# Patient Record
Sex: Female | Born: 1995
Health system: Southern US, Community
[De-identification: ages and names within clinical notes are randomized; demographics above are authoritative.]

## PROBLEM LIST (undated history)

## (undated) ENCOUNTER — Inpatient Hospital Stay (HOSPITAL_COMMUNITY): Payer: Self-pay

## (undated) DIAGNOSIS — R519 Headache, unspecified: Secondary | ICD-10-CM

## (undated) DIAGNOSIS — J45909 Unspecified asthma, uncomplicated: Secondary | ICD-10-CM

## (undated) DIAGNOSIS — A749 Chlamydial infection, unspecified: Secondary | ICD-10-CM

## (undated) HISTORY — PX: TOOTH EXTRACTION: SUR596

## (undated) HISTORY — DX: Unspecified asthma, uncomplicated: J45.909

## (undated) HISTORY — PX: INDUCED ABORTION: SHX677

---
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
## 2015-01-04 ENCOUNTER — Ambulatory Visit (INDEPENDENT_AMBULATORY_CARE_PROVIDER_SITE_OTHER): Payer: Medicaid Other | Admitting: Pediatrics

## 2015-01-04 ENCOUNTER — Encounter (INDEPENDENT_AMBULATORY_CARE_PROVIDER_SITE_OTHER): Payer: Self-pay

## 2015-01-04 ENCOUNTER — Encounter: Payer: Self-pay | Admitting: Pediatrics

## 2015-01-04 VITALS — BP 97/63 | HR 78 | Ht 63.5 in | Wt 123.4 lb

## 2015-01-04 DIAGNOSIS — Z720 Tobacco use: Secondary | ICD-10-CM | POA: Diagnosis not present

## 2015-01-04 DIAGNOSIS — F17200 Nicotine dependence, unspecified, uncomplicated: Secondary | ICD-10-CM

## 2015-01-04 DIAGNOSIS — Z113 Encounter for screening for infections with a predominantly sexual mode of transmission: Secondary | ICD-10-CM | POA: Diagnosis not present

## 2015-01-04 DIAGNOSIS — Z309 Encounter for contraceptive management, unspecified: Secondary | ICD-10-CM

## 2015-01-04 DIAGNOSIS — Z3046 Encounter for surveillance of implantable subdermal contraceptive: Secondary | ICD-10-CM

## 2015-01-04 MED ORDER — BUPROPION HCL ER (XL) 150 MG PO TB24: 150.0000 mg | ORAL_TABLET | Freq: Every day | ORAL | Status: DC

## 2015-01-04 NOTE — Progress Notes (Signed)
Adolescent Medicine Consultation Follow-Up Visit Molly Hensley  is a 19 y.o. female referred by Angelina Pih, MD here today for follow-up of nexplanon.   Previsit planning completed:  yes  Growth Chart Viewed? yes  PCP Confirmed?  Needs reassignment    History was provided by the patient.  HPI:  Going to school at the [employer]. Enjoying that. nexplanon has been going well. Started a period on Friday that has been very light. She didn't have one for 5 months before that. No new partners since last visit, however, current partner likely has other partners as they aren't exclusive. Still has some cramping sometimes. No change in vaginal d/c.     Still having dizziness sometimes but is much better than before.   She is still smoking and would like to quit. She has tried on her own in the past and would like to talk about medication options for help.   Patient's last menstrual period was 12/30/2014 (approximate).  The following portions of the patient's history were reviewed and updated as appropriate: allergies, current medications, past family history, past medical history, past social history and problem list.  No Known Allergies   Review of Systems  Constitutional: Negative for weight loss and malaise/fatigue.  Eyes: Negative for blurred vision.  Respiratory: Negative for shortness of breath.   Cardiovascular: Negative for chest pain and palpitations.  Gastrointestinal: Negative for nausea, vomiting, abdominal pain and constipation.  Genitourinary: Negative for dysuria.  Musculoskeletal: Negative for myalgias.  Neurological: Positive for headaches. Negative for dizziness.  Psychiatric/Behavioral: Negative for depression.     Social History: Sleep: sleeping well  Eating Habits: eating well  Exercise: None School: [employer] school  Future Plans: doing hair and makeup.   Confidentiality was discussed with the patient and if applicable, with caregiver as  well.  Patient's personal or confidential phone number:  Tobacco? yes, about a pack a week  Secondhand smoke exposure?yes Drugs/EtOH?yes, occasional drinking and MJ Sexually active?yes Pregnancy Prevention: nexplanon, reviewed condoms & plan B Safe at home, in school & in relationships? Yes Guns in the home? no Safe to self? Yes  Physical Exam:  Filed Vitals:   01/04/15 0954  BP: 97/63  Pulse: 78  Height: 5' 3.5" (1.613 m)  Weight: 123 lb 6.4 oz (55.974 kg)   BP 97/63 mmHg  Pulse 78  Ht 5' 3.5" (1.613 m)  Wt 123 lb 6.4 oz (55.974 kg)  BMI 21.51 kg/m2  LMP 12/30/2014 (Approximate) Body mass index: body mass index is 21.51 kg/(m^2). Blood pressure percentiles are 11% systolic and 44% diastolic based on 2000 NHANES data. Blood pressure percentile targets: 90: 123/79, 95: 127/83, 99 + 5 mmHg: 139/95.  Physical Exam  Constitutional: She is oriented to person, place, and time. She appears well-developed and well-nourished.  HENT:  Head: Normocephalic.  Neck: No thyromegaly present.  Cardiovascular: Normal rate, regular rhythm, normal heart sounds and intact distal pulses.   Pulmonary/Chest: Effort normal and breath sounds normal.  Abdominal: Soft. Bowel sounds are normal. There is no tenderness.  Musculoskeletal: Normal range of motion.  Neurological: She is alert and oriented to person, place, and time.  Skin: Skin is warm and dry.  Psychiatric: She has a normal mood and affect.    Assessment/Plan: 1. Surveillance of implantable subdermal contraceptive In place with no complications. Patient is happy with contraceptive method. Having some minimal bleeding and cramping. Discussed return precautions.   2. Tobacco use disorder Will try wellbutrin xl 150 mg daily to  help with smoking cessation as patient is ready to quit at this time. Discussed taking daily for 1 week and then stopping cigarettes. Will use for 8-12 weeks and then d/c if cessation is going well. Provided  materials for further reading on smoking cessation and habit changes.   - buPROPion (WELLBUTRIN XL) 150 MG 24 hr tablet; Take 1 tablet (150 mg total) by mouth daily.  Dispense: 30 tablet; Refill: 0  3. Routine screening for STI (sexually transmitted infection) Given that patient has a partner with whom she is not exclusive, will screen for STI in the setting of some new bleeding with nexplanon.  - GC/chlamydia probe amp, urine   Follow-up:  1 month about wellbutrin and smoking cessation   Medical decision-making:  > 25 minutes spent, more than 50% of appointment was spent discussing diagnosis and management of symptoms

## 2015-01-04 NOTE — Patient Instructions (Addendum)
Take wellbutrin xl every morning when you wake up. After 1 week of taking, stop smoking. We will leave you on the medication for 8-12 weeks as you continue to work through quitting. We will see you back in 1 month to see how it is going.   Smoking Cessation, Tips for Success If you are ready to quit smoking, congratulations! You have chosen to help yourself be healthier. Cigarettes bring nicotine, tar, carbon monoxide, and other irritants into your body. Your lungs, heart, and blood vessels will be able to work better without these poisons. There are many different ways to quit smoking. Nicotine gum, nicotine patches, a nicotine inhaler, or nicotine nasal spray can help with physical craving. Hypnosis, support groups, and medicines help break the habit of smoking. WHAT THINGS CAN I DO TO MAKE QUITTING EASIER?  Here are some tips to help you quit for good:  Pick a date when you will quit smoking completely. Tell all of your friends and family about your plan to quit on that date.  Do not try to slowly cut down on the number of cigarettes you are smoking. Pick a quit date and quit smoking completely starting on that day.  Throw away all cigarettes.   Clean and remove all ashtrays from your home, work, and car.  On a card, write down your reasons for quitting. Carry the card with you and read it when you get the urge to smoke.  Cleanse your body of nicotine. Drink enough water and fluids to keep your urine clear or pale yellow. Do this after quitting to flush the nicotine from your body.  Learn to predict your moods. Do not let a bad situation be your excuse to have a cigarette. Some situations in your life might tempt you into wanting a cigarette.  Never have "just one" cigarette. It leads to wanting another and another. Remind yourself of your decision to quit.  Change habits associated with smoking. If you smoked while driving or when feeling stressed, try other activities to replace smoking.  Stand up when drinking your coffee. Brush your teeth after eating. Sit in a different chair when you read the paper. Avoid alcohol while trying to quit, and try to drink fewer caffeinated beverages. Alcohol and caffeine may urge you to smoke.  Avoid foods and drinks that can trigger a desire to smoke, such as sugary or spicy foods and alcohol.  Ask people who smoke not to smoke around you.  Have something planned to do right after eating or having a cup of coffee. For example, plan to take a walk or exercise.  Try a relaxation exercise to calm you down and decrease your stress. Remember, you may be tense and nervous for the first 2 weeks after you quit, but this will pass.  Find new activities to keep your hands busy. Play with a pen, coin, or rubber band. Doodle or draw things on paper.  Brush your teeth right after eating. This will help cut down on the craving for the taste of tobacco after meals. You can also try mouthwash.   Use oral substitutes in place of cigarettes. Try using lemon drops, carrots, cinnamon sticks, or chewing gum. Keep them handy so they are available when you have the urge to smoke.  When you have the urge to smoke, try deep breathing.  Designate your home as a nonsmoking area.  If you are a heavy smoker, ask your health care provider about a prescription for nicotine chewing gum.  It can ease your withdrawal from nicotine.  Reward yourself. Set aside the cigarette money you save and buy yourself something nice.  Look for support from others. Join a support group or smoking cessation program. Ask someone at home or at work to help you with your plan to quit smoking.  Always ask yourself, "Do I need this cigarette or is this just a reflex?" Tell yourself, "Today, I choose not to smoke," or "I do not want to smoke." You are reminding yourself of your decision to quit.  Do not replace cigarette smoking with electronic cigarettes (commonly called e-cigarettes). The  safety of e-cigarettes is unknown, and some may contain harmful chemicals.  If you relapse, do not give up! Plan ahead and think about what you will do the next time you get the urge to smoke. HOW WILL I FEEL WHEN I QUIT SMOKING? You may have symptoms of withdrawal because your body is used to nicotine (the addictive substance in cigarettes). You may crave cigarettes, be irritable, feel very hungry, cough often, get headaches, or have difficulty concentrating. The withdrawal symptoms are only temporary. They are strongest when you first quit but will go away within 10-14 days. When withdrawal symptoms occur, stay in control. Think about your reasons for quitting. Remind yourself that these are signs that your body is healing and getting used to being without cigarettes. Remember that withdrawal symptoms are easier to treat than the major diseases that smoking can cause.  Even after the withdrawal is over, expect periodic urges to smoke. However, these cravings are generally short lived and will go away whether you smoke or not. Do not smoke! WHAT RESOURCES ARE AVAILABLE TO HELP ME QUIT SMOKING? Your health care provider can direct you to community resources or hospitals for support, which may include:  Group support.  Education.  Hypnosis.  Therapy. Document Released: 03/15/2004 Document Revised: 11/01/2013 Document Reviewed: 12/03/2012 [facility] Patient Information 2015 [place], [place]. This information is not intended to replace advice given to you by your health care provider. Make sure you discuss any questions you have with your health care provider. Bupropion extended-release tablets (Depression/Mood Disorders) What is this medicine? BUPROPION (byoo PROE pee on) is used to treat depression. This medicine may be used for other purposes; ask your health care provider or pharmacist if you have questions. COMMON BRAND NAME(S): Aplenzin, Budeprion XL, Forfivo XL, Wellbutrin XL What should I  tell my health care provider before I take this medicine? They need to know if you have any of these conditions: -an eating disorder, such as anorexia or bulimia -bipolar disorder or psychosis -diabetes or high blood sugar, treated with medication -glaucoma -head injury or brain tumor -heart disease, previous heart attack, or irregular heart beat -high blood pressure -kidney or liver disease -seizures (convulsions) -suicidal thoughts or a previous suicide attempt -Tourette's syndrome -weight loss -an unusual or allergic reaction to bupropion, other medicines, foods, dyes, or preservatives -breast-feeding -pregnant or trying to become pregnant How should I use this medicine? Take this medicine by mouth with a glass of water. Follow the directions on the prescription label. You can take it with or without food. If it upsets your stomach, take it with food. Do not crush, chew, or cut these tablets. This medicine is taken once daily at the same time each day. Do not take your medicine more often than directed. Do not stop taking this medicine suddenly except upon the advice of your doctor. Stopping this medicine too quickly may cause  serious side effects or your condition may worsen. A special MedGuide will be given to you by the pharmacist with each prescription and refill. Be sure to read this information carefully each time. Talk to your pediatrician regarding the use of this medicine in children. Special care may be needed. Overdosage: If you think you have taken too much of this medicine contact a poison control center or emergency room at once. NOTE: This medicine is only for you. Do not share this medicine with others. What if I miss a dose? If you miss a dose, skip the missed dose and take your next tablet at the regular time. Do not take double or extra doses. What may interact with this medicine? Do not take this medicine with any of the following medications: -linezolid -MAOIs like  Azilect, Carbex, Eldepryl, Marplan, Nardil, and Parnate -methylene blue (injected into a vein) -other medicines that contain bupropion like Zyban This medicine may also interact with the following medications: -alcohol -certain medicines for anxiety or sleep -certain medicines for blood pressure like metoprolol, propranolol -certain medicines for depression or psychotic disturbances -certain medicines for HIV or AIDS like efavirenz, lopinavir, nelfinavir, ritonavir -certain medicines for irregular heart beat like propafenone, flecainide -certain medicines for Parkinson's disease like amantadine, levodopa -certain medicines for seizures like carbamazepine, phenytoin, phenobarbital -cimetidine -clopidogrel -cyclophosphamide -furazolidone -isoniazid -nicotine -orphenadrine -procarbazine -steroid medicines like prednisone or cortisone -stimulant medicines for attention disorders, weight loss, or to stay awake -tamoxifen -theophylline -thiotepa -ticlopidine -tramadol -warfarin This list may not describe all possible interactions. Give your health care provider a list of all the medicines, herbs, non-prescription drugs, or dietary supplements you use. Also tell them if you smoke, drink alcohol, or use illegal drugs. Some items may interact with your medicine. What should I watch for while using this medicine? Tell your doctor if your symptoms do not get better or if they get worse. Visit your doctor or health care professional for regular checks on your progress. Because it may take several weeks to see the full effects of this medicine, it is important to continue your treatment as prescribed by your doctor. Patients and their families should watch out for new or worsening thoughts of suicide or depression. Also watch out for sudden changes in feelings such as feeling anxious, agitated, panicky, irritable, hostile, aggressive, impulsive, severely restless, overly excited and hyperactive, or  not being able to sleep. If this happens, especially at the beginning of treatment or after a change in dose, call your health care professional. Avoid alcoholic drinks while taking this medicine. Drinking large amounts of alcoholic beverages, using sleeping or anxiety medicines, or quickly stopping the use of these agents while taking this medicine may increase your risk for a seizure. Do not drive or use heavy machinery until you know how this medicine affects you. This medicine can impair your ability to perform these tasks. Do not take this medicine close to bedtime. It may prevent you from sleeping. Your mouth may get dry. Chewing sugarless gum or sucking hard candy, and drinking plenty of water may help. Contact your doctor if the problem does not go away or is severe. The tablet shell for some brands of this medicine does not dissolve. This is normal. The tablet shell may appear whole in the stool. This is not a cause for concern. What side effects may I notice from receiving this medicine? Side effects that you should report to your doctor or health care professional as soon as possible: -allergic reactions  like skin rash, itching or hives, swelling of the face, lips, or tongue -breathing problems -changes in vision -confusion -fast or irregular heartbeat -hallucinations -increased blood pressure -redness, blistering, peeling or loosening of the skin, including inside the mouth -seizures -suicidal thoughts or other mood changes -unusually weak or tired -vomiting Side effects that usually do not require medical attention (report to your doctor or health care professional if they continue or are bothersome): -change in sex drive or performance -constipation -headache -loss of appetite -nausea -tremors -weight loss This list may not describe all possible side effects. Call your doctor for medical advice about side effects. You may report side effects to FDA at 1-800-FDA-1088. Where  should I keep my medicine? Keep out of the reach of children. Store at room temperature between 15 and 30 degrees C (59 and 86 degrees F). Throw away any unused medicine after the expiration date. NOTE: This sheet is a summary. It may not cover all possible information. If you have questions about this medicine, talk to your doctor, pharmacist, or health care provider.  2015, [employer]. (2013-01-08 12:39:42)

## 2015-01-05 ENCOUNTER — Telehealth: Payer: Self-pay | Admitting: *Deleted

## 2015-01-05 LAB — GC/CHLAMYDIA PROBE AMP, URINE
Chlamydia, Swab/Urine, PCR: POSITIVE — AB
GC Probe Amp, Urine: NEGATIVE

## 2015-01-05 NOTE — Telephone Encounter (Signed)
-----   Message from Verneda Skillaroline T Hacker, FNP sent at 01/05/2015 10:14 AM EDT ----- Patient should come to clinic to be treated and obtain EPT. Can come to see me tomorrow or Neysa BonitoChristy in the PM. If totally unable to make an appointment will send RX to pharmacy and partner can visit health department.

## 2015-01-05 NOTE — Telephone Encounter (Signed)
TC returned by pt. Pt scheduled 01/06/15 with Rayfield Citizenaroline for tx. Pt verbalized understanding. Agreeable to appt.

## 2015-01-05 NOTE — Telephone Encounter (Signed)
TC to pt. LVM to callback and discuss recent labs. Callback number provided, confidentiality maintained.

## 2015-01-06 ENCOUNTER — Ambulatory Visit (INDEPENDENT_AMBULATORY_CARE_PROVIDER_SITE_OTHER): Payer: Medicaid Other | Admitting: Pediatrics

## 2015-01-06 ENCOUNTER — Encounter: Payer: Self-pay | Admitting: Pediatrics

## 2015-01-06 VITALS — BP 122/77 | HR 100 | Ht 63.5 in | Wt 121.0 lb

## 2015-01-06 DIAGNOSIS — A749 Chlamydial infection, unspecified: Secondary | ICD-10-CM | POA: Diagnosis not present

## 2015-01-06 DIAGNOSIS — Z113 Encounter for screening for infections with a predominantly sexual mode of transmission: Secondary | ICD-10-CM | POA: Diagnosis not present

## 2015-01-06 LAB — HIV ANTIBODY (ROUTINE TESTING W REFLEX): HIV 1&2 Ab, 4th Generation: NONREACTIVE

## 2015-01-06 MED ORDER — AZITHROMYCIN 250 MG PO TABS
1000.0000 mg | ORAL_TABLET | Freq: Once | ORAL | Status: AC
  Administered 2015-01-06: 1000 mg via ORAL
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
## 2015-01-06 NOTE — Patient Instructions (Signed)
No sex for 7 days after treatment or you risk reinfection.  ALWAYS use condoms. Any body fluid contact can transmit infections.   [email or web address] -- use this anonymous texting service to let other partners know that they have been exposed so they can get treated as well.   Have someone take the pills to your partner for treatment if you are unable to.   We will call you with the other lab results next week.

## 2015-01-07 LAB — RPR

## 2015-01-09 ENCOUNTER — Telehealth: Payer: Self-pay | Admitting: *Deleted

## 2015-01-09 NOTE — Telephone Encounter (Signed)
-----   Message from Verneda Skillaroline T Hacker, FNP sent at 01/08/2015  5:09 PM EDT ----- HIV and RPR negative. Continue to use condoms and we will rescreen for chlamydia at next visit.

## 2015-01-09 NOTE — Telephone Encounter (Signed)
TC returned by pt, reviewed negative results, advised we will recheck at next appt, and reminding of f/u appt date.

## 2015-01-09 NOTE — Telephone Encounter (Signed)
TC to pt, LVM requesting callback to discuss recent labs. Number provided, confidentiality maintained.

## 2015-02-01 ENCOUNTER — Other Ambulatory Visit: Payer: Self-pay | Admitting: Pediatrics

## 2015-02-01 ENCOUNTER — Emergency Department (HOSPITAL_COMMUNITY)
Admission: EM | Admit: 2015-02-01 | Discharge: 2015-02-01 | Disposition: A | Discharge status: Home / Self Care | Payer: Medicaid Other | Attending: Emergency Medicine | Admitting: Emergency Medicine

## 2015-02-01 ENCOUNTER — Encounter (HOSPITAL_COMMUNITY): Payer: Self-pay | Admitting: *Deleted

## 2015-02-01 DIAGNOSIS — N63 Unspecified lump in unspecified breast: Secondary | ICD-10-CM

## 2015-02-01 DIAGNOSIS — Z720 Tobacco use: Secondary | ICD-10-CM | POA: Insufficient documentation

## 2015-02-01 DIAGNOSIS — Z79899 Other long term (current) drug therapy: Secondary | ICD-10-CM | POA: Diagnosis not present

## 2015-02-01 DIAGNOSIS — Z793 Long term (current) use of hormonal contraceptives: Secondary | ICD-10-CM | POA: Diagnosis not present

## 2015-02-01 DIAGNOSIS — N644 Mastodynia: Secondary | ICD-10-CM | POA: Diagnosis present

## 2015-02-01 DIAGNOSIS — N6001 Solitary cyst of right breast: Secondary | ICD-10-CM

## 2015-02-01 NOTE — ED Provider Notes (Deleted)
CSN: [id number]     Arrival date & time 02/01/15  0902 History   First MD Initiated Contact with Patient 02/01/15 [phone]     Chief Complaint  Patient presents with  . Breast Problem     (Consider location/radiation/quality/duration/timing/severity/associated sxs/prior Treatment) HPI  History reviewed. No pertinent past medical history. History reviewed. No pertinent past surgical history. History reviewed. No pertinent family history. History  Substance Use Topics  . Smoking status: Current Every Day Smoker  . Smokeless tobacco: Never Used     Comment: patient   . Alcohol Use: No   OB History    No data available     Review of Systems    Allergies  Review of patient's allergies indicates no known allergies.  Home Medications   Prior to Admission medications   Medication Sig Start Date End Date Taking? Authorizing Provider  buPROPion (WELLBUTRIN XL) 150 MG 24 hr tablet Take 1 tablet (150 mg total) by mouth daily. 01/04/15   Verneda Skill, FNP  etonogestrel (IMPLANON) 68 MG IMPL implant 1 each by Subdermal route once.    Historical Provider, MD  meclizine (ANTIVERT) 50 MG tablet Take 1 tablet (50 mg total) by mouth 3 (three) times daily as needed. 06/12/14   Tatyana Kirichenko, PA-C  ondansetron (ZOFRAN-ODT) 4 MG disintegrating tablet Take 1 tablet (4 mg total) by mouth every 8 (eight) hours as needed for nausea or vomiting. 09/11/14   Charm Rings, MD   BP 100/54 mmHg  Pulse 84  Temp(Src) 98.5 F (36.9 C) (Oral)  Resp 20  Ht  (1.6 m)  Wt 121 lb 5 oz (55.027 kg)  BMI 21.49 kg/m2  SpO2 100%  LMP 12/30/2014 (LMP Unknown) Physical Exam  ED Course  Procedures (including critical care time) Labs Review Labs Reviewed - No data to display  Imaging Review No results found.   EKG Interpretation None      MDM   Final diagnoses:  Breast lump       Elson Areas, PA-C 02/01/15 [phone]

## 2015-02-01 NOTE — ED Notes (Signed)
Declined W/C at D/C and was escorted to lobby by RN. 

## 2015-02-01 NOTE — Discharge Instructions (Signed)
Breast Cyst A breast cyst is a sac in the breast that is filled with fluid. Breast cysts are common in women. Women can have one or many cysts. When the breasts contain many cysts, it is usually due to a noncancerous (benign) condition called fibrocystic change. These lumps form under the influence of female hormones (estrogen and progesterone). The lumps are most often located in the upper, outer portion of the breast. They are often more swollen, painful, and tender before your period starts. They usually disappear after menopause, unless you are on hormone therapy.  There are several types of cysts:  Macrocyst. This is a cyst that is about 2 in. (5.1 cm) in diameter.   Microcyst. This is a tiny cyst that you cannot feel but can be seen with a mammogram or an ultrasound.   Galactocele. This is a cyst containing milk that may develop if you suddenly stop breastfeeding.   Sebaceous cyst of the skin. This type of cyst is not in the breast tissue itself. Breast cysts do not increase your risk of breast cancer. However, they must be monitored closely because they can be cancerous.  CAUSES  It is not known exactly what causes a breast cyst to form. Possible causes include:  An overgrowth of milk glands and connective tissue in the breast can block the milk glands, causing them to fill with fluid.   Scar tissue in the breast from previous surgery may block the glands, causing a cyst.  RISK FACTORS Estrogen may influence the development of a breast cyst.  SIGNS AND SYMPTOMS   Feeling a smooth, round, soft lump (like a grape) in the breast that is easily moveable.   Breast discomfort or pain.  Increase in size of the lump before your menstrual period and decrease in its size after your menstrual period.  DIAGNOSIS  A cyst can be felt during a physical exam by your health care provider. A breast X-ray exam (mammogram) and ultrasonography will be done to confirm the diagnosis. Fluid may  be removed from the cyst with a needle (fine needle aspiration) to make sure the cyst is not cancerous.  TREATMENT  Treatment may not be necessary. Your health care provider may monitor the cyst to see if it goes away on its own. If treatment is needed, it may include:  Hormone treatment.   Needle aspiration. There is a chance of the cyst coming back after aspiration.   Surgery to remove the whole cyst.  HOME CARE INSTRUCTIONS   Keep all follow-up appointments with your health care provider.  See your health care provider regularly:  Get a yearly exam by your health care provider.  Have a clinical breast exam by a health care provider every 1-3 years if you are 4-49 years of age. After age 70 years, you should have the exam every year.   Get mammogram tests as directed by your health care provider.   Understand the normal appearance and feel of your breasts and perform breast self-exams.   Only take over-the-counter or prescription medicines as directed by your health care provider.   Wear a supportive bra, especially when exercising.   Avoid caffeine.   Reduce your salt intake, especially before your menstrual period. Too much salt can cause fluid retention, breast swelling, and discomfort.  SEEK MEDICAL CARE IF:   You feel, or think you feel, a lump in your breast.   You notice that both breasts look or feel different than usual.   Your  breast is still causing pain after your menstrual period is over.   You need medicine for breast pain and swelling that occurs with your menstrual period.  SEEK IMMEDIATE MEDICAL CARE IF:   You have severe pain, tenderness, redness, or warmth in your breast.   You have nipple discharge or bleeding.   Your breast lump becomes hard and painful.   You find new lumps or bumps that were not there before.   You feel lumps in your armpit (axilla).   You notice dimpling or wrinkling of the breast or nipple.   You  have a fever.  MAKE SURE YOU:  Understand these instructions.  Will watch your condition.  Will get help right away if you are not doing well or get worse. Document Released: 06/17/2005 Document Revised: 02/17/2013 Document Reviewed: 01/14/2013 [facility] Patient Information 2015 [place], [place]. This information is not intended to replace advice given to you by your health care provider. Make sure you discuss any questions you have with your health care provider. Warm compresses, ibuprofen

## 2015-02-01 NOTE — ED Provider Notes (Signed)
CSN: [id number]     Arrival date & time 02/01/15  0902 History  This chart was scribed for non-physician practitioner, Langston Masker, PA-C, working with Blane Ohara, MD by Charline Bills, ED Scribe. This patient was seen in room TR06C/TR06C and the patient's care was started at 9:34 AM.  Chief Complaint  Patient presents with  . Breast Problem   The history is provided by the patient. No language interpreter was used.   HPI Comments: Molly Hensley is a 19 y.o. female who presents to the Emergency Department complaining of a gradually worsening painful mass to the right breast first noticed 3 days ago. Pt states that the mass is very tender to palpation. She denies fever, chills, vomiting, diarrhea, drainage from her right nipple. No treatments tried PTA.  PCP: Jairo Ben, MD  History reviewed. No pertinent past medical history. History reviewed. No pertinent past surgical history. History reviewed. No pertinent family history. History  Substance Use Topics  . Smoking status: Current Every Day Smoker  . Smokeless tobacco: Never Used     Comment: patient   . Alcohol Use: No   OB History    No data available     Review of Systems  Constitutional: Negative for fever and chills.  Gastrointestinal: Negative for vomiting and diarrhea.  Skin:       + R breast mass   Allergies  Review of patient's allergies indicates no known allergies.  Home Medications   Prior to Admission medications   Medication Sig Start Date End Date Taking? Authorizing Provider  buPROPion (WELLBUTRIN XL) 150 MG 24 hr tablet Take 1 tablet (150 mg total) by mouth daily. 01/04/15   Verneda Skill, FNP  etonogestrel (IMPLANON) 68 MG IMPL implant 1 each by Subdermal route once.    Historical Provider, MD  meclizine (ANTIVERT) 50 MG tablet Take 1 tablet (50 mg total) by mouth 3 (three) times daily as needed. 06/12/14   Tatyana Kirichenko, PA-C  ondansetron (ZOFRAN-ODT) 4 MG disintegrating tablet Take 1  tablet (4 mg total) by mouth every 8 (eight) hours as needed for nausea or vomiting. 09/11/14   Charm Rings, MD   BP 100/54 mmHg  Pulse 84  Temp(Src) 98.5 F (36.9 C) (Oral)  Resp 20  SpO2 100%  LMP 12/30/2014 (Approximate) Physical Exam  Constitutional: She is oriented to person, place, and time. She appears well-developed and well-nourished. No distress.  HENT:  Head: Normocephalic and atraumatic.  Eyes: Conjunctivae and EOM are normal.  Neck: Neck supple. No tracheal deviation present.  Cardiovascular: Normal rate.   Pulmonary/Chest: Effort normal. No respiratory distress.  Genitourinary:  R breast: Marble sized nodule to R breast midline. Tenderness to palpation.  Musculoskeletal: Normal range of motion.  Neurological: She is alert and oriented to person, place, and time.  Skin: Skin is warm and dry.  Psychiatric: She has a normal mood and affect. Her behavior is normal.  Nursing note and vitals reviewed.  ED Course  Procedures (including critical care time) DIAGNOSTIC STUDIES: Oxygen Saturation is 100% on RA, normal by my interpretation.    COORDINATION OF CARE: 9:37 AM-Discussed treatment plan which includes ibuprofen, warm compresses and follow-up with PCP with pt at bedside and pt agreed to plan.   Labs Review Labs Reviewed - No data to display  Imaging Review No results found.   EKG Interpretation None      MDM   Final diagnoses:  Breast lump    [facility]  I personally performed the services in this documentation, which was scribed in my presence.  The recorded information has been reviewed and considered.   Barnet Pall.  Lonia Skinner [place], PA-C 02/01/15 1633  Blane Ohara, MD 02/01/15 [phone]

## 2015-02-01 NOTE — ED Notes (Signed)
Pt reports knott to RT breast . Pt reports breast tissue is sore to touch. Pt denies any drainage from nipple.

## 2015-02-03 ENCOUNTER — Encounter: Payer: Self-pay | Admitting: Pediatrics

## 2015-02-03 ENCOUNTER — Ambulatory Visit
Admission: RE | Admit: 2015-02-03 | Discharge: 2015-02-03 | Disposition: A | Discharge status: Home / Self Care | Payer: Medicaid Other | Source: Ambulatory Visit | Attending: Pediatrics | Admitting: Pediatrics

## 2015-02-03 DIAGNOSIS — N6001 Solitary cyst of right breast: Secondary | ICD-10-CM

## 2015-02-03 NOTE — Progress Notes (Signed)
Pre-Visit Planning  Molly Hensley  is a 19 y.o. female referred by Jairo Ben, MD.   Last seen in [facility] on 01/06/15 for chlamydia treatment.   Previous Psych Screenings?  no  Treatment plan at last visit included treatment of chlamydia, discussion of nexplanon and wellbutrin for smoking cessation.   Clinical Staff Visit Tasks:   - Urine GC/CT due? yes - Psych Screenings Due? no   Provider Visit Tasks: - discuss nexplanon bleeding after chlamydia tx -ensure partners treated  -discuss smoking cessation  - Pertinent Labs? Yes Results for orders placed or performed in visit on 01/06/15  HIV antibody  Result Value Ref Range   HIV 1&2 Ab, 4th Generation NONREACTIVE NONREACTIVE  RPR  Result Value Ref Range   RPR Ser Ql NON REAC NON REAC

## 2015-02-06 ENCOUNTER — Encounter: Payer: Self-pay | Admitting: Pediatrics

## 2015-02-06 ENCOUNTER — Ambulatory Visit (INDEPENDENT_AMBULATORY_CARE_PROVIDER_SITE_OTHER): Payer: Medicaid Other | Admitting: Pediatrics

## 2015-02-06 VITALS — BP 104/64 | HR 93 | Ht 63.5 in | Wt 121.4 lb

## 2015-02-06 DIAGNOSIS — Z309 Encounter for contraceptive management, unspecified: Secondary | ICD-10-CM | POA: Diagnosis not present

## 2015-02-06 DIAGNOSIS — Z3046 Encounter for surveillance of implantable subdermal contraceptive: Secondary | ICD-10-CM

## 2015-02-06 DIAGNOSIS — Z113 Encounter for screening for infections with a predominantly sexual mode of transmission: Secondary | ICD-10-CM

## 2015-02-06 NOTE — Progress Notes (Signed)
THIS RECORD MAY CONTAIN CONFIDENTIAL INFORMATION THAT SHOULD NOT BE RELEASED WITHOUT REVIEW OF THE SERVICE PROVIDER.  Adolescent Medicine Consultation Follow-Up Visit Molly Hensley  is a 19 y.o. female referred by Kalman Jewels, MD here today for follow-up of chalmydia, breast lump.    Previsit planning completed:  Yes Pre-Visit Planning  Molly Hensley is a 19 y.o. female referred by Jairo Ben, MD.  Last seen in [facility] on 01/06/15 for chlamydia treatment.   Previous Psych Screenings? no  Treatment plan at last visit included treatment of chlamydia, discussion of nexplanon and wellbutrin for smoking cessation.   Clinical Staff Visit Tasks:  - Urine GC/CT due? yes - Psych Screenings Due? no   Provider Visit Tasks: - discuss nexplanon bleeding after chlamydia tx -ensure partners treated  -discuss smoking cessation  - Pertinent Labs? Yes Results for orders placed or performed in visit on 01/06/15  HIV antibody  Result Value Ref Range   HIV 1&2 Ab, 4th Generation NONREACTIVE NONREACTIVE  RPR  Result Value Ref Range   RPR Ser Ql NON REAC NON REAC            Growth Chart Viewed? yes   History was provided by the patient.  PCP Confirmed?  yes  HPI:  Lump in breast resolved with pus drainage from it. Bleeding with nexplanon stopped. No vaginal s/sx. No burning with urination.   Was not able to provide EPT to partner as he wouldn't talk to her anymore. Hasn't been sexually active since then. No other concerns today.   Patient's last menstrual period was 12/30/2014 (lmp unknown). No Known Allergies   Medication List       This list is accurate as of: 02/06/15  9:27 AM.  Always use your most recent med list.               IMPLANON 68 MG Impl implant  Generic drug:  etonogestrel  1 each by Subdermal route once.       Review of Systems  Constitutional: Negative for weight loss and malaise/fatigue.  Eyes:  Negative for blurred vision.  Respiratory: Negative for shortness of breath.   Cardiovascular: Negative for chest pain and palpitations.  Gastrointestinal: Negative for nausea, vomiting, abdominal pain and constipation.  Genitourinary: Negative for dysuria.  Musculoskeletal: Negative for myalgias.  Neurological: Negative for dizziness and headaches.  Psychiatric/Behavioral: Negative for depression.    The following portions of the patient's history were reviewed and updated as appropriate: allergies, current medications, past family history, past medical history, past social history and problem list.  Physical Exam:  Filed Vitals:   02/06/15 0922  BP: 104/64  Pulse: 93  Height: 5' 3.5" (1.613 m)  Weight: 121 lb 6.4 oz (55.067 kg)   BP 104/64 mmHg  Pulse 93  Ht 5' 3.5" (1.613 m)  Wt 121 lb 6.4 oz (55.067 kg)  BMI 21.17 kg/m2  LMP 12/30/2014 (LMP Unknown) Body mass index: body mass index is 21.17 kg/(m^2). Blood pressure percentiles are 29% systolic and 48% diastolic based on 2000 NHANES data. Blood pressure percentile targets: 90: 123/79, 95: 127/82, 99 + 5 mmHg: 139/95.  Physical Exam  Constitutional: She is oriented to person, place, and time. She appears well-developed and well-nourished.  HENT:  Head: Normocephalic.  Neck: No thyromegaly present.  Cardiovascular: Normal rate, regular rhythm, normal heart sounds and intact distal pulses.   Pulmonary/Chest: Effort normal and breath sounds normal.  Abdominal: Soft. Bowel sounds are normal. There is no tenderness.  Musculoskeletal: Normal range of motion.  Neurological: She is alert and oriented to person, place, and time.  Skin: Skin is warm and dry.  Psychiatric: She has a normal mood and affect.    Assessment/Plan: 1. Surveillance of implantable subdermal contraceptive Bleeding has stopped. No further concerns today.   2. Routine screening for STI (sexually transmitted infection) Repeat today for test of reinfection.   - GC/chlamydia probe amp, urine   Follow-up:  Follow up PRN   Medical decision-making:  > 15 minutes spent, more than 50% of appointment was spent discussing diagnosis and management of symptoms

## 2015-02-06 NOTE — Patient Instructions (Signed)
We will see you back as needed for bleeding or other concerns.

## 2015-02-07 LAB — GC/CHLAMYDIA PROBE AMP, URINE
Chlamydia, Swab/Urine, PCR: NEGATIVE
GC Probe Amp, Urine: NEGATIVE

## 2015-03-16 ENCOUNTER — Encounter: Payer: Self-pay | Admitting: Pediatrics

## 2015-03-17 ENCOUNTER — Ambulatory Visit (INDEPENDENT_AMBULATORY_CARE_PROVIDER_SITE_OTHER): Payer: Medicaid Other | Admitting: Pediatrics

## 2015-03-17 ENCOUNTER — Encounter: Payer: Self-pay | Admitting: Pediatrics

## 2015-03-17 VITALS — BP 100/68 | Ht 63.5 in | Wt 123.0 lb

## 2015-03-17 DIAGNOSIS — Z720 Tobacco use: Secondary | ICD-10-CM

## 2015-03-17 DIAGNOSIS — Z68.41 Body mass index (BMI) pediatric, 5th percentile to less than 85th percentile for age: Secondary | ICD-10-CM | POA: Diagnosis not present

## 2015-03-17 DIAGNOSIS — Z0001 Encounter for general adult medical examination with abnormal findings: Secondary | ICD-10-CM | POA: Diagnosis not present

## 2015-03-17 DIAGNOSIS — R6889 Other general symptoms and signs: Secondary | ICD-10-CM

## 2015-03-17 DIAGNOSIS — Z13220 Encounter for screening for lipoid disorders: Secondary | ICD-10-CM | POA: Insufficient documentation

## 2015-03-17 DIAGNOSIS — F17200 Nicotine dependence, unspecified, uncomplicated: Secondary | ICD-10-CM

## 2015-03-17 DIAGNOSIS — Z3046 Encounter for surveillance of implantable subdermal contraceptive: Secondary | ICD-10-CM

## 2015-03-17 DIAGNOSIS — Z309 Encounter for contraceptive management, unspecified: Secondary | ICD-10-CM | POA: Diagnosis not present

## 2015-03-17 LAB — LIPID PANEL
Cholesterol: 126 mg/dL (ref 125–170)
HDL: 49 mg/dL (ref 36–76)
LDL Cholesterol: 67 mg/dL (ref <110)
Total CHOL/HDL Ratio: 2.6 Ratio (ref <=5.0)
Triglycerides: 50 mg/dL (ref 40–136)
VLDL: 10 mg/dL (ref <30)

## 2015-03-17 NOTE — Progress Notes (Signed)
Routine Well-Adolescent Visit  PCP: Jairo Ben, MD   History was provided by the patient. [phone]  Molly Hensley is a 19 y.o. female who is here for CPE.  Current concerns: None  Prior Concerns:  Had a breast abscess in August-likely from a piercing that resolved after it drained on its own. She no longer has a piercing. She has never had an abcess befeore.  She is followed by adolescent clinic for nexplanon. It was placed 05/2014. She has no breakthrough bleeding or problems.   Patient has had chlamydia in the past 2 months ago. She was treated and negative 1 month ago. SHe is not currently not sexually active. Her partner was notified but she does not know if he was tested. SHe has not had sexual activity since her last negative test. HIV and RPR were negative.  Healthy weight. Eats healthy food. Exercises daily. Very active. Never had lipids done. No known family history of heart disease or elevated cholesterol.   Adolescent Assessment:  Confidentiality was discussed with the patient and if applicable, with caregiver as well.  Home and Environment:  Lives with: lives at home with mom and 20 year old twin brother Parental relations: good Friends/Peers: good friends Nutrition/Eating Behaviors: as above Sports/Exercise:  Active lifesyle  Education and Employment:  School Status: completed high school School History: In [employer] school Work: Not currently Activities: school  With parent out of the room and confidentiality discussed:   Patient reports being comfortable and safe at school and at home? Yes  Smoking: yes, 1 packs per week for 2 years Secondhand smoke exposure? yes - brother Drugs/EtOH: Marijuana 2 times per week   Menstruation:   Menarche: post menarchal, onset age 62 last menses if female: Has implant Menstrual History: no breakthroiug bleeding   Sexuality:hetero Sexually active? Not currently  sexual partners in last  year:2 contraception use: condoms, nexplanon Last STI Screening: 01/2015  Violence/Abuse: denies Mood: Suicidality and Depression: denies Weapons: none  Screenings: The patient completed the Rapid Assessment for Adolescent Preventive Services screening questionnaire and the following topics were identified as risk factors and discussed: healthy eating, tobacco use, marijuana use, condom use, birth control and sexuality  In addition, the following topics were discussed as part of anticipatory guidance healthy eating, exercise, tobacco use, marijuana use, drug use, condom use, birth control and sexuality.  PHQ-9 completed and results indicated 0-low risk  Physical Exam:  BP 100/68 mmHg  Ht 5' 3.5" (1.613 m)  Wt 123 lb (55.792 kg)  BMI 21.44 kg/m2 Blood pressure percentiles are 18% systolic and 63% diastolic based on 2000 NHANES data.   General Appearance:   alert, oriented, no acute distress and well nourished  Engaged teen  HENT: Normocephalic, no obvious abnormality, conjunctiva clear  Mouth:   Normal appearing teeth, no obvious discoloration, dental caries, or dental caps Tongue piercing  Neck:   Supple; thyroid: no enlargement, symmetric, no tenderness/mass/nodules  Lungs:   Clear to auscultation bilaterally, normal work of breathing Normal breast exam. Self breast exam demonstrated  Heart:   Regular rate and rhythm, S1 and S2 normal, no murmurs;   Abdomen:   Soft, non-tender, no mass, or organomegaly  GU genitalia not examined  Musculoskeletal:   Tone and strength strong and symmetrical, all extremities               Lymphatic:   No cervical adenopathy  Skin/Hair/Nails:   Skin warm, dry and intact, no rashes, no bruises or petechiae  Neurologic:  Strength, gait, and coordination normal and age-appropriate    Assessment/Plan:  1. Encounter for general adult medical examination with abnormal findings Doing well. Normal exam today. Reviewed risks of tobacco and marijuana use.  Prior history of chlamydia. Treated and negative at follow up. Now using condoms. HIV and RPR negative.  2. Body mass index, pediatric, 5th percentile to less than 85th percentile for age Reviewed healthy diet and encouraged daily exercise.  3. Surveillance of implantable subdermal contraceptive No current problems  4. Tobacco use disorder Reviewed risks. She is now down to 1-2 cigarettes daily and is almost ready to quit.  5. Screening for lipid disorders -routine screening. No known risk factors. - Lipid panel   BMI: is appropriate for age  Immunizations today: per orders.  Dental list given Plans to establish care at [facility]. Marland Kitchen   Jairo Ben, MD

## 2015-03-17 NOTE — Patient Instructions (Signed)
Please call [facility] for new patient appointment. It might take 6 months to get that appointment. The phone number is [phone]  Dental list          updated 1.22.15 These dentists all accept Medicaid.  The list is for your convenience in choosing your child's dentist. Estos dentistas aceptan Medicaid.  La lista es para su [place] y es una cortesa.     [facility]     [phone] [address]  [address] [place] [address] Se habla espaol From 60 to 68 years old Parent may go with child Vinson Moselle DDS     [phone] [address] [place]  [address] Se habla espaol From 69 to 73 years old Parent may NOT go with child  Marolyn Hammock DMD    [phone] [address] [place] [address] Se habla espaol [place] spoken From 68 years old Parent may go with child [facility]     [phone] [address] [place] [address] Se habla espaol From 6 to 77 years old Parent may NOT go with child  Winfield Rast DDS     [phone] [facility]      [address]  [place] [address] No se habla espaol From teeth coming in Parent may go with child  [facility]     [phone] [address] [place]. [place] [address] Requires certification. Call for information. Requiere certificacin. Llame para informacin. Algunos dias se habla espaol  From birth to 20 years Parent possibly goes with child  Bradd Canary DDS     [phone] [address] [place]  [address] [place] [address] Se habla espaol From 18 months to 18 years  Parent may go with child  J. [place] DDS    [phone] Garlon Hatchet DDS [address] [place] [address] Se habla espaol From 85 year old Parent may go with child  Melynda Ripple DDS    [phone] [address] [place] [address] Se habla espaol  From 37 months old Parent may go with child Dorian Pod DDS    [phone] [address] [place] [address] Se habla espaol From 43 to 89 years old Parent may go with child  [facility]    [phone] [address] [place] [address] No se habla espaol From birth Parent may not go with child

## 2015-11-06 ENCOUNTER — Encounter (HOSPITAL_COMMUNITY): Payer: Self-pay | Admitting: Emergency Medicine

## 2015-11-06 ENCOUNTER — Emergency Department (HOSPITAL_COMMUNITY)
Admission: EM | Admit: 2015-11-06 | Discharge: 2015-11-06 | Disposition: A | Discharge status: Home / Self Care | Payer: No Typology Code available for payment source | Attending: Emergency Medicine | Admitting: Emergency Medicine

## 2015-11-06 ENCOUNTER — Emergency Department (HOSPITAL_COMMUNITY): Payer: No Typology Code available for payment source

## 2015-11-06 DIAGNOSIS — Y92410 Unspecified street and highway as the place of occurrence of the external cause: Secondary | ICD-10-CM | POA: Diagnosis not present

## 2015-11-06 DIAGNOSIS — S79912A Unspecified injury of left hip, initial encounter: Secondary | ICD-10-CM | POA: Insufficient documentation

## 2015-11-06 DIAGNOSIS — Z3202 Encounter for pregnancy test, result negative: Secondary | ICD-10-CM | POA: Diagnosis not present

## 2015-11-06 DIAGNOSIS — S4992XA Unspecified injury of left shoulder and upper arm, initial encounter: Secondary | ICD-10-CM | POA: Insufficient documentation

## 2015-11-06 DIAGNOSIS — S0081XA Abrasion of other part of head, initial encounter: Secondary | ICD-10-CM | POA: Diagnosis not present

## 2015-11-06 DIAGNOSIS — Y9389 Activity, other specified: Secondary | ICD-10-CM | POA: Diagnosis not present

## 2015-11-06 DIAGNOSIS — Z23 Encounter for immunization: Secondary | ICD-10-CM | POA: Insufficient documentation

## 2015-11-06 DIAGNOSIS — Y998 Other external cause status: Secondary | ICD-10-CM | POA: Diagnosis not present

## 2015-11-06 DIAGNOSIS — S8992XA Unspecified injury of left lower leg, initial encounter: Secondary | ICD-10-CM | POA: Diagnosis not present

## 2015-11-06 DIAGNOSIS — S0990XA Unspecified injury of head, initial encounter: Secondary | ICD-10-CM | POA: Diagnosis present

## 2015-11-06 DIAGNOSIS — M25562 Pain in left knee: Secondary | ICD-10-CM

## 2015-11-06 DIAGNOSIS — F17200 Nicotine dependence, unspecified, uncomplicated: Secondary | ICD-10-CM | POA: Insufficient documentation

## 2015-11-06 DIAGNOSIS — M25512 Pain in left shoulder: Secondary | ICD-10-CM

## 2015-11-06 DIAGNOSIS — M25552 Pain in left hip: Secondary | ICD-10-CM

## 2015-11-06 LAB — POC URINE PREG, ED: Preg Test, Ur: NEGATIVE

## 2015-11-06 MED ORDER — HYDROCODONE-ACETAMINOPHEN 5-325 MG PO TABS: 1.0000 | ORAL_TABLET | Freq: Four times a day (QID) | ORAL | Status: DC | PRN

## 2015-11-06 MED ORDER — TETANUS-DIPHTH-ACELL PERTUSSIS 5-2.5-18.5 LF-MCG/0.5 IM SUSP
0.5000 mL | Freq: Once | INTRAMUSCULAR | Status: AC
  Administered 2015-11-06: 0.5 mL via INTRAMUSCULAR
  Filled 2015-11-06: qty 0.5
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
## 2015-11-06 NOTE — Discharge Instructions (Signed)
Contusion °A contusion is a deep bruise. Contusions are the result of a blunt injury to tissues and muscle fibers under the skin. The injury causes bleeding under the skin. The skin overlying the contusion may turn blue, purple, or yellow. Minor injuries will give you a painless contusion, but more severe contusions may stay painful and swollen for a few weeks.  °CAUSES  °This condition is usually caused by a blow, trauma, or direct force to an area of the body. °SYMPTOMS  °Symptoms of this condition include: °· Swelling of the injured area. °· Pain and tenderness in the injured area. °· Discoloration. The area may have redness and then turn blue, purple, or yellow. °DIAGNOSIS  °This condition is diagnosed based on a physical exam and medical history. An X-ray, CT scan, or MRI may be needed to determine if there are any associated injuries, such as broken bones (fractures). °TREATMENT  °Specific treatment for this condition depends on what area of the body was injured. In general, the best treatment for a contusion is resting, icing, applying pressure to (compression), and elevating the injured area. This is often called the RICE strategy. Over-the-counter anti-inflammatory medicines may also be recommended for pain control.  °HOME CARE INSTRUCTIONS  °· Rest the injured area. °· If directed, apply ice to the injured area: °· Put ice in a plastic bag. °· Place a towel between your skin and the bag. °· Leave the ice on for 20 minutes, 2-3 times per day. °· If directed, apply light compression to the injured area using an elastic bandage. Make sure the bandage is not wrapped too tightly. Remove and reapply the bandage as directed by your health care provider. °· If possible, raise (elevate) the injured area above the level of your heart while you are sitting or lying down. °· Take over-the-counter and prescription medicines only as told by your health care provider. °SEEK MEDICAL CARE IF: °· Your symptoms do not  improve after several days of treatment. °· Your symptoms get worse. °· You have difficulty moving the injured area. °SEEK IMMEDIATE MEDICAL CARE IF:  °· You have severe pain. °· You have numbness in a hand or foot. °· Your hand or foot turns pale or cold. °  °This information is not intended to replace advice given to you by your health care provider. Make sure you discuss any questions you have with your health care provider. °  °Document Released: 03/27/2005 Document Revised: 03/08/2015 Document Reviewed: 11/02/2014 °Elsevier Interactive Patient Education ©2016 Elsevier Inc. ° °Motor Vehicle Collision °It is common to have multiple bruises and sore muscles after a motor vehicle collision (MVC). These tend to feel worse for the first 24 hours. You may have the most stiffness and soreness over the first several hours. You may also feel worse when you wake up the first morning after your collision. After this point, you will usually begin to improve with each day. The speed of improvement often depends on the severity of the collision, the number of injuries, and the location and nature of these injuries. °HOME CARE INSTRUCTIONS °· Put ice on the injured area. °¨ Put ice in a plastic bag. °¨ Place a towel between your skin and the bag. °¨ Leave the ice on for 15-20 minutes, 3-4 times a day, or as directed by your health care provider. °· Drink enough fluids to keep your urine clear or pale yellow. Do not drink alcohol. °· Take a warm shower or bath once or twice a   day. This will increase blood flow to sore muscles. °· You may return to activities as directed by your caregiver. Be careful when lifting, as this may aggravate neck or back pain. °· Only take over-the-counter or prescription medicines for pain, discomfort, or fever as directed by your caregiver. Do not use aspirin. This may increase bruising and bleeding. °SEEK IMMEDIATE MEDICAL CARE IF: °· You have numbness, tingling, or weakness in the arms or  legs. °· You develop severe headaches not relieved with medicine. °· You have severe neck pain, especially tenderness in the middle of the back of your neck. °· You have changes in bowel or bladder control. °· There is increasing pain in any area of the body. °· You have shortness of breath, light-headedness, dizziness, or fainting. °· You have chest pain. °· You feel sick to your stomach (nauseous), throw up (vomit), or sweat. °· You have increasing abdominal discomfort. °· There is blood in your urine, stool, or vomit. °· You have pain in your shoulder (shoulder strap areas). °· You feel your symptoms are getting worse. °MAKE SURE YOU: °· Understand these instructions. °· Will watch your condition. °· Will get help right away if you are not doing well or get worse. °  °This information is not intended to replace advice given to you by your health care provider. Make sure you discuss any questions you have with your health care provider. °  °Document Released: 06/17/2005 Document Revised: 07/08/2014 Document Reviewed: 11/14/2010 °Elsevier Interactive Patient Education ©2016 Elsevier Inc. ° °

## 2015-11-06 NOTE — ED Notes (Signed)
Patient currently at xray via stretcher.

## 2015-11-06 NOTE — ED Notes (Signed)
Felicie Mornavid Smith, NP at bedside at this time.
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
## 2015-11-07 ENCOUNTER — Telehealth (HOSPITAL_BASED_OUTPATIENT_CLINIC_OR_DEPARTMENT_OTHER): Payer: Self-pay | Admitting: Emergency Medicine

## 2015-11-10 ENCOUNTER — Encounter (HOSPITAL_COMMUNITY): Payer: Self-pay | Admitting: *Deleted

## 2015-11-10 ENCOUNTER — Ambulatory Visit (HOSPITAL_COMMUNITY)
Admission: EM | Admit: 2015-11-10 | Discharge: 2015-11-10 | Disposition: A | Discharge status: Home / Self Care | Payer: No Typology Code available for payment source | Attending: Emergency Medicine | Admitting: Emergency Medicine

## 2015-11-10 DIAGNOSIS — M791 Myalgia, unspecified site: Secondary | ICD-10-CM

## 2015-11-10 DIAGNOSIS — F0781 Postconcussional syndrome: Secondary | ICD-10-CM | POA: Diagnosis not present

## 2015-11-10 DIAGNOSIS — M25562 Pain in left knee: Secondary | ICD-10-CM

## 2015-11-10 MED ORDER — KETOROLAC TROMETHAMINE 30 MG/ML IJ SOLN
INTRAMUSCULAR | Status: AC
  Filled 2015-11-10: qty 1

## 2015-11-10 MED ORDER — DICLOFENAC SODIUM 50 MG PO TBEC: 50.0000 mg | DELAYED_RELEASE_TABLET | Freq: Three times a day (TID) | ORAL | Status: DC

## 2015-11-10 MED ORDER — KETOROLAC TROMETHAMINE 30 MG/ML IJ SOLN
30.0000 mg | Freq: Once | INTRAMUSCULAR | Status: AC
  Administered 2015-11-10: 30 mg via INTRAMUSCULAR

## 2015-11-10 MED ORDER — CYCLOBENZAPRINE HCL 5 MG PO TABS: 5.0000 mg | ORAL_TABLET | Freq: Three times a day (TID) | ORAL | Status: DC | PRN
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
## 2015-11-10 NOTE — Discharge Instructions (Signed)
You likely have a concussion. Avoid anything that requires concentration or focus. Avoid screens for the next several days. Take diclofenac 3 times a day as needed. This will help both with the headaches and the muscle soreness. Use the Flexeril 3 times a day as needed for muscle pain. This medicine will make you drowsy. It is okay to take half of a Vicodin if you need to. Everything should improve over the next 1-2 weeks. The headaches may linger for up to a month or so. Follow-up as needed.

## 2015-11-15 ENCOUNTER — Encounter (HOSPITAL_COMMUNITY): Payer: Self-pay | Admitting: Emergency Medicine

## 2015-11-15 ENCOUNTER — Ambulatory Visit (HOSPITAL_COMMUNITY)
Admission: EM | Admit: 2015-11-15 | Discharge: 2015-11-15 | Disposition: A | Discharge status: Home / Self Care | Payer: Medicaid Other | Attending: Emergency Medicine | Admitting: Emergency Medicine

## 2015-11-15 DIAGNOSIS — F0781 Postconcussional syndrome: Secondary | ICD-10-CM | POA: Diagnosis not present

## 2015-11-15 MED ORDER — AMITRIPTYLINE HCL 25 MG PO TABS
25.0000 mg | ORAL_TABLET | Freq: Every day | ORAL | Status: DC
Start: 2015-11-15 — End: 2017-04-09

## 2015-11-15 MED ORDER — PROPRANOLOL HCL 10 MG PO TABS: 10.0000 mg | ORAL_TABLET | Freq: Two times a day (BID) | ORAL | Status: DC
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
## 2015-11-15 NOTE — Discharge Instructions (Signed)
Post-Concussion Syndrome Post-concussion syndrome is the symptoms that can occur after a head injury. These symptoms can last from weeks to months. HOME CARE   Take medicines only as told by your doctor.  Do not take aspirin.  Sleep with your head raised to help with headaches.  Avoid activities that can cause another head injury.  Do not play contact sports like football, hockey, soccer, or basketball.  Do not do other risky activities like downhill skiing, martial arts, or horseback riding until your doctor says it is okay.  Keep all follow-up visits as told by your doctor. This is important. GET HELP IF:   You have a harder time:  Paying attention.  Focusing.  Remembering.  Learning new information.  Dealing with stress.  You need more time to complete tasks.  You are easily bothered (irritable).  You have more symptoms. Get help if you have any of these symptoms for more than two weeks after your injury:   Long-lasting (chronic) headaches.  Dizziness.  Trouble balancing.  Feeling sick to your stomach (nauseous).  Trouble with your vision.  Noise or light bothers you more.  Depression.  Mood swings.  Feeling worried (anxious).  Easily bothered.  Memory problems.  Trouble concentrating or paying attention.  Sleep problems.  Feeling tired all of the time. GET HELP RIGHT AWAY IF:  You feel confused.  You feel very sleepy.  You are hard to wake up.  You feel sick to your stomach.  You keep throwing up (vomiting).  You feel like you are moving when you are not (vertigo).  Your eyes move back and forth very quickly.  You start shaking (convulsing) or pass out (faint).  You have very bad headaches that do not get better with medicine.  You cannot use your arms or legs like normal.  One of the black centers of your eyes (pupils) is bigger than the other.  You have clear or bloody fluid coming from your nose or ears.  Your problems  get worse, not better. MAKE SURE YOU:  Understand these instructions.  Will watch your condition.  Will get help right away if you are not doing well or get worse.   This information is not intended to replace advice given to you by your health care provider. Make sure you discuss any questions you have with your health care provider.   Document Released: 07/25/2004 Document Revised: 07/08/2014 Document Reviewed: 09/22/2013 Elsevier Interactive Patient Education 2016 [employer]  Take the amitriptyline at bedtime as this will make you sleepy. Take the propranolol twice a day. This medicine may make you a little dizzy the first few times you take it. If your headaches are not improving in the next week, please go to the emergency room as you will likely need imaging of your head.
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
## 2016-01-17 NOTE — ED Provider Notes (Signed)
Medical screening examination/treatment/procedure(s) were performed by non-physician practitioner and as supervising physician I was immediately available for consultation/collaboration.   EKG Interpretation None       Jacalyn LefevreJulie Lamonte Hartt, MD 01/17/16 [phone]

## 2016-01-25 ENCOUNTER — Encounter: Payer: Self-pay | Admitting: Pediatrics

## 2016-08-09 ENCOUNTER — Encounter (HOSPITAL_COMMUNITY): Payer: Self-pay | Admitting: Emergency Medicine

## 2016-08-09 ENCOUNTER — Ambulatory Visit (HOSPITAL_COMMUNITY)
Admission: EM | Admit: 2016-08-09 | Discharge: 2016-08-09 | Disposition: A | Discharge status: Home / Self Care | Payer: Medicaid Other | Attending: Family Medicine | Admitting: Family Medicine

## 2016-08-09 DIAGNOSIS — R110 Nausea: Secondary | ICD-10-CM

## 2016-08-09 DIAGNOSIS — R6889 Other general symptoms and signs: Secondary | ICD-10-CM | POA: Diagnosis not present

## 2016-08-09 MED ORDER — OSELTAMIVIR PHOSPHATE 75 MG PO CAPS: 75.0000 mg | ORAL_CAPSULE | Freq: Two times a day (BID) | ORAL | 0 refills | Status: DC

## 2016-08-09 MED ORDER — IPRATROPIUM BROMIDE 0.06 % NA SOLN: 2.0000 | Freq: Four times a day (QID) | NASAL | 0 refills | Status: DC

## 2016-08-09 MED ORDER — BENZONATATE 100 MG PO CAPS: 100.0000 mg | ORAL_CAPSULE | Freq: Three times a day (TID) | ORAL | 0 refills | Status: DC

## 2016-08-09 MED ORDER — AZITHROMYCIN 250 MG PO TABS: 250.0000 mg | ORAL_TABLET | Freq: Every day | ORAL | 0 refills | Status: DC

## 2016-08-09 MED ORDER — ONDANSETRON 4 MG PO TBDP: 4.0000 mg | ORAL_TABLET | Freq: Three times a day (TID) | ORAL | 0 refills | Status: DC | PRN

## 2016-08-09 NOTE — ED Provider Notes (Signed)
CSN: [id number]     Arrival date & time 08/09/16  1128 History   None    Chief Complaint  Patient presents with  . Emesis   (Consider location/radiation/quality/duration/timing/severity/associated sxs/prior Treatment) Patient c/o NVD x 1 day and has been having flu sx's.   The history is provided by the patient.  Emesis  Severity:  Moderate Duration:  2 days Timing:  Constant Quality:  Stomach contents Able to tolerate:  Liquids Progression:  Worsening Recent urination:  Normal Relieved by:  Nothing Worsened by:  Nothing Associated symptoms: diarrhea     History reviewed. No pertinent past medical history. History reviewed. No pertinent surgical history. No family history on file. Social History  Substance Use Topics  . Smoking status: Current Every Day Smoker    Packs/day: 0.50    Types: Cigarettes  . Smokeless tobacco: Never Used     Comment: patient   . Alcohol use No   OB History    No data available     Review of Systems  Constitutional: Negative.   HENT: Negative.   Eyes: Negative.   Respiratory: Negative.   Cardiovascular: Negative.   Gastrointestinal: Positive for diarrhea, nausea and vomiting.  Endocrine: Negative.   Genitourinary: Negative.   Musculoskeletal: Negative.   Neurological: Negative.   Hematological: Negative.   Psychiatric/Behavioral: Negative.     Allergies  Patient has no known allergies.  Home Medications   Prior to Admission medications   Medication Sig Start Date End Date Taking? Authorizing Provider  amitriptyline (ELAVIL) 25 MG tablet Take 1 tablet (25 mg total) by mouth at bedtime. 11/15/15   Charm RingsErin J Honig, MD  etonogestrel (IMPLANON) 68 MG IMPL implant 1 each by Subdermal route once.    Historical Provider, MD  ondansetron (ZOFRAN ODT) 4 MG disintegrating tablet Take 1 tablet (4 mg total) by mouth every 8 (eight) hours as needed for nausea or vomiting. 08/09/16   Deatra CanterWilliam J Tinsley Lomas, FNP  oseltamivir (TAMIFLU) 75 MG capsule Take  1 capsule (75 mg total) by mouth every 12 (twelve) hours. 08/09/16   Deatra CanterWilliam J Glada Wickstrom, FNP  propranolol (INDERAL) 10 MG tablet Take 1 tablet (10 mg total) by mouth 2 (two) times daily. 11/15/15   Charm RingsErin J Honig, MD   Meds Ordered and Administered this Visit  Medications - No data to display  BP 109/63   Pulse 98   Temp 99.7 F (37.6 C) (Oral)   Resp 16   Ht 5\' 3"  (1.6 m)   Wt 130 lb (59 kg)   LMP 08/03/2016   SpO2 100%   BMI 23.03 kg/m  No data found.   Physical Exam  Constitutional: She appears well-developed and well-nourished.  HENT:  Head: Normocephalic and atraumatic.  Right Ear: External ear normal.  Left Ear: External ear normal.  Mouth/Throat: Oropharynx is clear and moist.  Eyes: Conjunctivae and EOM are normal. Pupils are equal, round, and reactive to light.  Neck: Normal range of motion. Neck supple.  Cardiovascular: Normal rate, regular rhythm and normal heart sounds.   Pulmonary/Chest: Effort normal and breath sounds normal.  Abdominal: Soft. Bowel sounds are normal.  Nursing note and vitals reviewed.   Urgent Care Course     Procedures (including critical care time)  Labs Review Labs Reviewed - No data to display  Imaging Review No results found.   Visual Acuity Review  Right Eye Distance:   Left Eye Distance:   Bilateral Distance:    Right Eye Near:   Left Eye  Near:    Bilateral Near:         MDM   1. Flu-like symptoms   2. Nausea    tamiflu Zofran  Push po fluids, rest, tylenol and motrin otc prn as directed for fever, arthralgias, and myalgias.  Follow up prn if sx's continue or persist.    Deatra Canter, FNP 08/09/16 1330

## 2016-08-09 NOTE — ED Triage Notes (Signed)
PT woke up at 6am with vomiting, diarrhea, and sore throat. PT has vomited 3 times and had 2 episodes of diarrhea.

## 2017-03-30 ENCOUNTER — Encounter (HOSPITAL_COMMUNITY): Payer: Self-pay | Admitting: Emergency Medicine

## 2017-03-30 ENCOUNTER — Ambulatory Visit (HOSPITAL_COMMUNITY)
Admission: EM | Admit: 2017-03-30 | Discharge: 2017-03-30 | Disposition: A | Discharge status: Home / Self Care | Payer: Medicaid Other | Attending: Urgent Care | Admitting: Urgent Care

## 2017-03-30 DIAGNOSIS — R110 Nausea: Secondary | ICD-10-CM | POA: Diagnosis not present

## 2017-03-30 DIAGNOSIS — R51 Headache: Secondary | ICD-10-CM | POA: Diagnosis not present

## 2017-03-30 DIAGNOSIS — R519 Headache, unspecified: Secondary | ICD-10-CM

## 2017-03-30 MED ORDER — KETOROLAC TROMETHAMINE 60 MG/2ML IM SOLN
INTRAMUSCULAR | Status: AC
  Filled 2017-03-30: qty 2

## 2017-03-30 MED ORDER — ONDANSETRON 8 MG PO TBDP
8.0000 mg | ORAL_TABLET | Freq: Three times a day (TID) | ORAL | 0 refills | Status: DC | PRN
Start: 2017-03-30 — End: 2017-04-09

## 2017-03-30 MED ORDER — SUMATRIPTAN SUCCINATE 50 MG PO TABS: ORAL_TABLET | ORAL | 0 refills | Status: DC

## 2017-03-30 MED ORDER — CELECOXIB 100 MG PO CAPS: 100.0000 mg | ORAL_CAPSULE | Freq: Two times a day (BID) | ORAL | 1 refills | Status: DC

## 2017-03-30 MED ORDER — KETOROLAC TROMETHAMINE 60 MG/2ML IM SOLN
60.0000 mg | Freq: Once | INTRAMUSCULAR | Status: AC
  Administered 2017-03-30: 60 mg via INTRAMUSCULAR

## 2017-03-30 NOTE — Discharge Instructions (Signed)
Please set up an office visit with PA-Weber at [facility] for consideration of Nexplanon removal. Contact Primary at [facility] to set up an office visit at [phone].

## 2017-03-30 NOTE — ED Triage Notes (Signed)
Headache since Tuesday.  Headache does not go away.  Pain in forehead.  Denies cold symptoms

## 2017-03-30 NOTE — ED Provider Notes (Addendum)
MRN: [id number] DOB: [birth year]  Subjective:   Molly Hensley is a 21 y.o. female presenting for chief complaint of Headache  Reports 5 day history of frontal headache. Headache is constant, has pressure behind her eyes, feels a fullness in her head, has tinnitus, ear pressure as well, intermittent dizziness. Has photophobia, phonophobia. Has tried to sleep of her headache. Has tried APAP, naproxen, Goody powder. Denies fever, confusion, numbness, weakness, sore throat, sinus congestion, sore throat, cough. Has not smoked cigarettes, has been trying to quit, smokes ~4 cigarettes per day. Has occasional alcohol drink. Drinks ~1 gallon of water per day. Sleeps ~8 hours. Eats regular meals. Does not drink coffee. Has a history of migraines, vertigo.   Patient has Nexplanon and has No Known Allergies.  Molly Hensley denies past medical and surgical history.   Objective:   Vitals: BP 115/65 (BP Location: Left Arm)   Pulse 87   Temp 98.1 F (36.7 C) (Oral)   SpO2 100%   Physical Exam  Constitutional: She is oriented to person, place, and time. She appears well-developed and well-nourished.  HENT:  Mouth/Throat: Oropharynx is clear and moist.  Eyes: Pupils are equal, round, and reactive to light. EOM are normal. Right eye exhibits no discharge. Left eye exhibits no discharge.  Neck: Normal range of motion. Neck supple.  Cardiovascular: Normal rate, regular rhythm and intact distal pulses.  Exam reveals no gallop and no friction rub.   No murmur heard. Pulmonary/Chest: Effort normal. No respiratory distress. She has no wheezes. She has no rales.  Lymphadenopathy:    She has no cervical adenopathy.  Neurological: She is alert and oriented to person, place, and time. She displays normal reflexes. No cranial nerve deficit. Coordination normal.  Skin: Skin is warm and dry.  Psychiatric: She has a normal mood and affect.   Assessment and Plan :   Acute nonintractable headache, unspecified headache  type  Nausea without vomiting  Patient symptoms consistent with migraines. Physical exam findings are very reassuring. She will try Celebrex but if this does not work, provided patient with script for Imitrex as well. Counseled that she is not supposed to use more than one NSAID at a time and also should use either Celebrex or Imitrex. Maintain healthy lifestyle. Set up PCP to continue to manage headaches. Return-to-clinic precautions discussed, patient verbalized understanding.   Wallis Bamberg, PA-C [facility]  03/30/2017  6:51 PM    Wallis Bamberg, PA-C 03/30/17 2330    Wallis Bamberg, PA-C 03/30/17 [phone]

## 2017-03-31 ENCOUNTER — Ambulatory Visit: Payer: Self-pay | Admitting: Family Medicine

## 2017-03-31 NOTE — Progress Notes (Deleted)
  No chief complaint on file.   HPI  No past medical history on file.  Current Outpatient Prescriptions  Medication Sig Dispense Refill  . amitriptyline (ELAVIL) 25 MG tablet Take 1 tablet (25 mg total) by mouth at bedtime. 30 tablet 0  . celecoxib (CELEBREX) 100 MG capsule Take 1 capsule (100 mg total) by mouth 2 (two) times daily. 30 capsule 1  . etonogestrel (IMPLANON) 68 MG IMPL implant 1 each by Subdermal route once.    . ondansetron (ZOFRAN-ODT) 8 MG disintegrating tablet Take 1 tablet (8 mg total) by mouth every 8 (eight) hours as needed for nausea. 15 tablet 0  . oseltamivir (TAMIFLU) 75 MG capsule Take 1 capsule (75 mg total) by mouth every 12 (twelve) hours. 10 capsule 0  . propranolol (INDERAL) 10 MG tablet Take 1 tablet (10 mg total) by mouth 2 (two) times daily. 60 tablet 0  . SUMAtriptan (IMITREX) 50 MG tablet Take 1 tablet at the start of a migraine. If the headache persists, then take 1 more tablet in 2 hours. Do not exceed 2 tablets in a day. 30 tablet 0   No current facility-administered medications for this visit.     Allergies: No Known Allergies  No past surgical history on file.  Social History   Social History  . Marital status: Single    Spouse name: N/A  . Number of children: N/A  . Years of education: N/A   Social History Main Topics  . Smoking status: Current Every Day Smoker    Packs/day: 0.50    Types: Cigarettes  . Smokeless tobacco: Never Used     Comment: patient   . Alcohol use No  . Drug use: Yes    Types: Marijuana     Comment: occasionally  . Sexual activity: Yes    Birth control/ protection: Implant   Other Topics Concern  . Not on file   Social History Narrative  . No narrative on file    ROS  Objective: There were no vitals filed for this visit.  Physical Exam  Assessment and Plan There are no diagnoses linked to this encounter.   Delores P [employer]

## 2017-04-07 ENCOUNTER — Ambulatory Visit (INDEPENDENT_AMBULATORY_CARE_PROVIDER_SITE_OTHER): Payer: Medicaid Other | Admitting: Pediatrics

## 2017-04-07 ENCOUNTER — Encounter: Payer: Self-pay | Admitting: Pediatrics

## 2017-04-07 VITALS — BP 123/86 | HR 98 | Ht 64.37 in | Wt 149.4 lb

## 2017-04-07 DIAGNOSIS — Z30015 Encounter for initial prescription of vaginal ring hormonal contraceptive: Secondary | ICD-10-CM | POA: Diagnosis not present

## 2017-04-07 DIAGNOSIS — Z3046 Encounter for surveillance of implantable subdermal contraceptive: Secondary | ICD-10-CM | POA: Diagnosis not present

## 2017-04-07 NOTE — Patient Instructions (Signed)
We took your nexplanon out today.  Start nuvaring today  Use it for 3 weeks and then take it out the fourth week and have a period  We will see you in 8 weeks
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
## 2017-04-09 MED ORDER — ETONOGESTREL-ETHINYL ESTRADIOL 0.12-0.015 MG/24HR VA RING: VAGINAL_RING | VAGINAL | 4 refills | Status: DC

## 2017-06-09 ENCOUNTER — Ambulatory Visit: Payer: Self-pay | Admitting: Pediatrics

## 2017-07-07 ENCOUNTER — Emergency Department (HOSPITAL_COMMUNITY)
Admission: EM | Admit: 2017-07-07 | Discharge: 2017-07-07 | Disposition: A | Discharge status: Home / Self Care | Payer: Medicaid Other | Attending: Emergency Medicine | Admitting: Emergency Medicine

## 2017-07-07 DIAGNOSIS — J069 Acute upper respiratory infection, unspecified: Secondary | ICD-10-CM | POA: Insufficient documentation

## 2017-07-07 DIAGNOSIS — F17210 Nicotine dependence, cigarettes, uncomplicated: Secondary | ICD-10-CM | POA: Insufficient documentation

## 2017-07-07 MED ORDER — BENZONATATE 100 MG PO CAPS: 100.0000 mg | ORAL_CAPSULE | Freq: Three times a day (TID) | ORAL | 0 refills | Status: DC

## 2017-07-07 MED ORDER — IBUPROFEN 600 MG PO TABS: 600.0000 mg | ORAL_TABLET | Freq: Four times a day (QID) | ORAL | 0 refills | Status: DC | PRN

## 2017-07-07 NOTE — ED Triage Notes (Signed)
Pt reports sore throat with chills body aches diarrhea for 3 days.
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
## 2018-04-09 ENCOUNTER — Other Ambulatory Visit: Payer: Self-pay | Admitting: Pediatrics

## 2018-04-09 DIAGNOSIS — Z30015 Encounter for initial prescription of vaginal ring hormonal contraceptive: Secondary | ICD-10-CM

## 2018-06-11 ENCOUNTER — Encounter (HOSPITAL_COMMUNITY): Payer: Self-pay | Admitting: Emergency Medicine

## 2018-06-11 ENCOUNTER — Ambulatory Visit (HOSPITAL_COMMUNITY)
Admission: EM | Admit: 2018-06-11 | Discharge: 2018-06-11 | Disposition: A | Discharge status: Home / Self Care | Payer: Medicaid Other | Attending: Family Medicine | Admitting: Family Medicine

## 2018-06-11 ENCOUNTER — Ambulatory Visit (HOSPITAL_COMMUNITY)
Admission: EM | Admit: 2018-06-11 | Discharge: 2018-06-11 | Discharge status: Against Medical Advice | Payer: Medicaid Other

## 2018-06-11 DIAGNOSIS — J069 Acute upper respiratory infection, unspecified: Secondary | ICD-10-CM | POA: Insufficient documentation

## 2018-06-11 DIAGNOSIS — R05 Cough: Secondary | ICD-10-CM

## 2018-06-11 DIAGNOSIS — B9789 Other viral agents as the cause of diseases classified elsewhere: Secondary | ICD-10-CM

## 2018-06-11 MED ORDER — DM-GUAIFENESIN ER 30-600 MG PO TB12: 1.0000 | ORAL_TABLET | Freq: Two times a day (BID) | ORAL | 0 refills | Status: DC

## 2018-06-11 MED ORDER — BENZONATATE 100 MG PO CAPS: 100.0000 mg | ORAL_CAPSULE | Freq: Three times a day (TID) | ORAL | 0 refills | Status: DC

## 2018-06-11 NOTE — ED Triage Notes (Signed)
Pt presents to ED for assessment of nasal congestion, cough, sore throat and headaches x 1 week.  States her job is upset with her having been sick, requesting a note.  Patient states "I feel like I am getting better"

## 2018-06-11 NOTE — Discharge Instructions (Signed)
I believe is a viral upper respiratory infection You can try some Mucinex DM for your symptoms I will send some of this to the pharmacy You can also try Tessalon Perles for cough Follow up as needed for continued or worsening symptoms
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
## 2019-02-08 DIAGNOSIS — Z0389 Encounter for observation for other suspected diseases and conditions ruled out: Secondary | ICD-10-CM | POA: Diagnosis not present

## 2019-02-08 DIAGNOSIS — Z1388 Encounter for screening for disorder due to exposure to contaminants: Secondary | ICD-10-CM | POA: Diagnosis not present

## 2019-02-08 DIAGNOSIS — Z3009 Encounter for other general counseling and advice on contraception: Secondary | ICD-10-CM | POA: Diagnosis not present

## 2019-02-26 DIAGNOSIS — Z3200 Encounter for pregnancy test, result unknown: Secondary | ICD-10-CM | POA: Diagnosis not present

## 2019-02-26 DIAGNOSIS — Z3009 Encounter for other general counseling and advice on contraception: Secondary | ICD-10-CM | POA: Diagnosis not present

## 2019-04-13 DIAGNOSIS — Z0389 Encounter for observation for other suspected diseases and conditions ruled out: Secondary | ICD-10-CM | POA: Diagnosis not present

## 2019-04-13 DIAGNOSIS — Z1388 Encounter for screening for disorder due to exposure to contaminants: Secondary | ICD-10-CM | POA: Diagnosis not present

## 2019-04-13 DIAGNOSIS — Z3009 Encounter for other general counseling and advice on contraception: Secondary | ICD-10-CM | POA: Diagnosis not present

## 2019-07-27 DIAGNOSIS — Z01419 Encounter for gynecological examination (general) (routine) without abnormal findings: Secondary | ICD-10-CM | POA: Diagnosis not present

## 2019-07-27 DIAGNOSIS — Z1388 Encounter for screening for disorder due to exposure to contaminants: Secondary | ICD-10-CM | POA: Diagnosis not present

## 2019-07-27 DIAGNOSIS — Z30015 Encounter for initial prescription of vaginal ring hormonal contraceptive: Secondary | ICD-10-CM | POA: Diagnosis not present

## 2019-07-27 DIAGNOSIS — Z113 Encounter for screening for infections with a predominantly sexual mode of transmission: Secondary | ICD-10-CM | POA: Diagnosis not present

## 2019-07-27 DIAGNOSIS — N760 Acute vaginitis: Secondary | ICD-10-CM | POA: Diagnosis not present

## 2019-07-27 DIAGNOSIS — Z0389 Encounter for observation for other suspected diseases and conditions ruled out: Secondary | ICD-10-CM | POA: Diagnosis not present

## 2019-07-27 DIAGNOSIS — Z114 Encounter for screening for human immunodeficiency virus [HIV]: Secondary | ICD-10-CM | POA: Diagnosis not present

## 2019-07-27 DIAGNOSIS — B373 Candidiasis of vulva and vagina: Secondary | ICD-10-CM | POA: Diagnosis not present

## 2019-07-27 DIAGNOSIS — Z3009 Encounter for other general counseling and advice on contraception: Secondary | ICD-10-CM | POA: Diagnosis not present

## 2019-09-30 ENCOUNTER — Other Ambulatory Visit: Payer: Self-pay

## 2019-09-30 ENCOUNTER — Ambulatory Visit (HOSPITAL_COMMUNITY)
Admission: EM | Admit: 2019-09-30 | Discharge: 2019-09-30 | Disposition: A | Discharge status: Home / Self Care | Payer: Self-pay

## 2019-09-30 ENCOUNTER — Encounter (HOSPITAL_COMMUNITY): Payer: Self-pay | Admitting: Emergency Medicine

## 2019-09-30 DIAGNOSIS — K645 Perianal venous thrombosis: Secondary | ICD-10-CM

## 2019-09-30 MED ORDER — LIDOCAINE HCL 2 % IJ SOLN
INTRAMUSCULAR | Status: AC
  Filled 2019-09-30: qty 20
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
## 2019-09-30 NOTE — Discharge Instructions (Addendum)
We opened the thrombosed hemorrhoid up today and this should give you a lot of relief.  Do warm sitz bath and you can use the external cream as needed.  Follow up as needed for continued or worsening symptoms

## 2019-09-30 NOTE — ED Triage Notes (Signed)
PT reports a swollen painful rectum. She believes she has a hemorrhoid. This started after a difficult BM Sunday.

## 2019-12-10 DIAGNOSIS — Z3009 Encounter for other general counseling and advice on contraception: Secondary | ICD-10-CM | POA: Diagnosis not present

## 2019-12-10 DIAGNOSIS — Z0389 Encounter for observation for other suspected diseases and conditions ruled out: Secondary | ICD-10-CM | POA: Diagnosis not present

## 2019-12-10 DIAGNOSIS — Z1388 Encounter for screening for disorder due to exposure to contaminants: Secondary | ICD-10-CM | POA: Diagnosis not present

## 2019-12-15 ENCOUNTER — Encounter (HOSPITAL_COMMUNITY): Payer: Self-pay | Admitting: Obstetrics & Gynecology

## 2019-12-15 ENCOUNTER — Other Ambulatory Visit: Payer: Self-pay

## 2019-12-15 ENCOUNTER — Inpatient Hospital Stay (HOSPITAL_COMMUNITY)
Admission: AD | Admit: 2019-12-15 | Discharge: 2019-12-15 | Disposition: A | Discharge status: Home / Self Care | Payer: Medicaid Other | Attending: Obstetrics & Gynecology | Admitting: Obstetrics & Gynecology

## 2019-12-15 DIAGNOSIS — Z3A01 Less than 8 weeks gestation of pregnancy: Secondary | ICD-10-CM

## 2019-12-15 DIAGNOSIS — Z3A10 10 weeks gestation of pregnancy: Secondary | ICD-10-CM | POA: Insufficient documentation

## 2019-12-15 DIAGNOSIS — F17210 Nicotine dependence, cigarettes, uncomplicated: Secondary | ICD-10-CM | POA: Diagnosis not present

## 2019-12-15 DIAGNOSIS — O99331 Smoking (tobacco) complicating pregnancy, first trimester: Secondary | ICD-10-CM | POA: Diagnosis not present

## 2019-12-15 DIAGNOSIS — O26891 Other specified pregnancy related conditions, first trimester: Secondary | ICD-10-CM | POA: Insufficient documentation

## 2019-12-15 DIAGNOSIS — Z791 Long term (current) use of non-steroidal anti-inflammatories (NSAID): Secondary | ICD-10-CM | POA: Insufficient documentation

## 2019-12-15 DIAGNOSIS — O219 Vomiting of pregnancy, unspecified: Secondary | ICD-10-CM | POA: Insufficient documentation

## 2019-12-15 LAB — URINALYSIS, ROUTINE W REFLEX MICROSCOPIC
Bilirubin Urine: NEGATIVE
Glucose, UA: NEGATIVE mg/dL
Hgb urine dipstick: NEGATIVE
Ketones, ur: NEGATIVE mg/dL
Leukocytes,Ua: NEGATIVE
Nitrite: NEGATIVE
Protein, ur: NEGATIVE mg/dL
Specific Gravity, Urine: 1.013 (ref 1.005–1.030)
pH: 7 (ref 5.0–8.0)

## 2019-12-15 LAB — POCT PREGNANCY, URINE: Preg Test, Ur: POSITIVE — AB

## 2019-12-15 MED ORDER — ONDANSETRON 4 MG PO TBDP
8.0000 mg | ORAL_TABLET | Freq: Once | ORAL | Status: AC
  Administered 2019-12-15: 8 mg via ORAL
  Filled 2019-12-15: qty 2

## 2019-12-15 MED ORDER — DOXYLAMINE-PYRIDOXINE 10-10 MG PO TBEC: 2.0000 | DELAYED_RELEASE_TABLET | Freq: Every day | ORAL | 1 refills | Status: DC

## 2019-12-15 NOTE — Discharge Instructions (Signed)
Safe Medications in Pregnancy   Acne: Benzoyl Peroxide Salicylic Acid  Backache/Headache: Tylenol: 2 regular strength every 4 hours OR              2 Extra strength every 6 hours  Colds/Coughs/Allergies: Benadryl (alcohol free) 25 mg every 6 hours as needed Breath right strips Claritin Cepacol throat lozenges Chloraseptic throat spray Cold-Eeze- up to three times per day Cough drops, alcohol free Flonase (by prescription only) Guaifenesin Mucinex Robitussin DM (plain only, alcohol free) Saline nasal spray/drops Sudafed (pseudoephedrine) & Actifed ** use only after [redacted] weeks gestation and if you do not have high blood pressure Tylenol Vicks Vaporub Zinc lozenges Zyrtec   Constipation: Colace Ducolax suppositories Fleet enema Glycerin suppositories Metamucil Milk of magnesia Miralax Senokot Smooth move tea  Diarrhea: Kaopectate Imodium A-D  *NO pepto Bismol  Hemorrhoids: Anusol Anusol HC Preparation H Tucks  Indigestion: Tums Maalox Mylanta Zantac  Pepcid  Insomnia: Benadryl (alcohol free) 25mg  every 6 hours as needed Tylenol PM Unisom, no Gelcaps  Leg Cramps: Tums MagGel  Nausea/Vomiting:  Bonine Dramamine Emetrol Ginger extract Sea bands Meclizine  Nausea medication to take during pregnancy:  Unisom (doxylamine succinate 25 mg tablets) Take one tablet daily at bedtime. If symptoms are not adequately controlled, the dose can be increased to a maximum recommended dose of two tablets daily (1/2 tablet in the morning, 1/2 tablet mid-afternoon and one at bedtime). Vitamin B6 100mg  tablets. Take one tablet twice a day (up to 200 mg per day).  Skin Rashes: Aveeno products Benadryl cream or 25mg  every 6 hours as needed Calamine Lotion 1% cortisone cream  Yeast infection: Gyne-lotrimin 7 Monistat 7  Gum/tooth pain: Anbesol  **If taking multiple medications, please check labels to avoid duplicating the same active ingredients **take  medication as directed on the label ** Do not exceed 4000 mg of tylenol in 24 hours **Do not take medications that contain aspirin or ibuprofen   Bland Diet A bland diet consists of foods that are often soft and do not have a lot of fat, fiber, or extra seasonings. Foods without fat, fiber, or seasoning are easier for the body to digest. They are also less likely to irritate your mouth, throat, stomach, and other parts of your digestive system. A bland diet is sometimes called a BRAT diet. What is my plan? Your health care provider or food and nutrition specialist (dietitian) may recommend specific changes to your diet to prevent symptoms or to treat your symptoms. These changes may include:  Eating small meals often.  Cooking food until it is soft enough to chew easily.  Chewing your food well.  Drinking fluids slowly.  Not eating foods that are very spicy, sour, or fatty.  Not eating citrus fruits, such as oranges and grapefruit. What do I need to know about this diet?  Eat a variety of foods from the bland diet food list.  Do not follow a bland diet longer than needed.  Ask your health care provider whether you should take vitamins or supplements. What foods can I eat? Grains  Hot cereals, such as cream of wheat. Rice. Bread, crackers, or tortillas made from refined white flour. Vegetables Canned or cooked vegetables. Mashed or boiled potatoes. Fruits  Bananas. Applesauce. Other types of cooked or canned fruit with the skin and seeds removed, such as canned peaches or pears. Meats and other proteins  Scrambled eggs. Creamy peanut butter or other nut butters. Lean, well-cooked meats, such as chicken or fish. Tofu. Soups  or broths. Dairy Low-fat dairy products, such as milk, cottage cheese, or yogurt. Beverages  Water. Herbal tea. Apple juice. Fats and oils Mild salad dressings. Canola or olive oil. Sweets and desserts Pudding. Custard. Fruit gelatin. Ice cream. The  items listed above may not be a complete list of recommended foods and beverages. Contact a dietitian for more options. What foods are not recommended? Grains Whole grain breads and cereals. Vegetables Raw vegetables. Fruits Raw fruits, especially citrus, berries, or dried fruits. Dairy Whole fat dairy foods. Beverages Caffeinated drinks. Alcohol. Seasonings and condiments Strongly flavored seasonings or condiments. Hot sauce. Salsa. Other foods Spicy foods. Fried foods. Sour foods, such as pickled or fermented foods. Foods with high sugar content. Foods high in fiber. The items listed above may not be a complete list of foods and beverages to avoid. Contact a dietitian for more information. Summary  A bland diet consists of foods that are often soft and do not have a lot of fat, fiber, or extra seasonings.  Foods without fat, fiber, or seasoning are easier for the body to digest.  Check with your health care provider to see how long you should follow this diet plan. It is not meant to be followed for long periods. This information is not intended to replace advice given to you by your health care provider. Make sure you discuss any questions you have with your health care provider. Document Revised: 07/16/2017 Document Reviewed: 07/16/2017 Elsevier Patient Education  2020 [employer].

## 2019-12-15 NOTE — MAU Note (Signed)
Presents with c/o N&V, can't keep anything down.  Reports vomited over 20 times in last 24 hours.  Also c/o H/A, hasn't taken Tylenol, took Dramamine, no relief.

## 2019-12-15 NOTE — MAU Provider Note (Signed)
History     CSN: [id number]  Arrival date and time: 12/15/19 1345   First Provider Initiated Contact with Patient 12/15/19 1442      Chief Complaint  Patient presents with  . Nausea  . Emesis   Molly Hensley is a 24 y.o. G2P0010 at [redacted]w[redacted]d by Definite LMP of Oct 30, 2019.  She presents today for Nausea and Emesis.  She states she "can't keep anything down and I'm hungry, I haven't eaten in 3 days and I was wondering what can I do." Patient states she has thrown up "about 20 times" in the past 24 hours. Patient states she is able to drink water and ginger ale, but "can't hold it for too long."  She reports intermittent abdominal cramping, but denies abdominal pain or vaginal concerns including discharge, bleeding, or odor.  Patient states she has tried eating soup (chicken noodle), fish sandwich, and crackers.  Patient states the smell of food makes her sick.  Patient reports last usage of MJ was yesterday and "I hit a blunt and when I do that I can eat."     Of note, patient was able to drink water and eat crackers prior to provider assessment.  But then had some mild vomiting while provider at bedside.      OB History    Gravida  2   Para      Term      Preterm      AB  1   Living        SAB      TAB  1   Ectopic      Multiple      Live Births              History reviewed. No pertinent past medical history.  History reviewed. No pertinent surgical history.  Family History  Problem Relation Age of Onset  . Hypertension Mother     Social History   Tobacco Use  . Smoking status: Current Every Day Smoker    Packs/day: 0.50    Types: Cigarettes  . Smokeless tobacco: Never Used  . Tobacco comment: patient   Vaping Use  . Vaping Use: Never used  Substance Use Topics  . Alcohol use: No    Alcohol/week: 0.0 standard drinks  . Drug use: Yes    Types: Marijuana    Comment: occasionally    Allergies: No Known Allergies  Medications Prior to Admission   Medication Sig Dispense Refill Last Dose  . benzonatate (TESSALON) 100 MG capsule Take 1 capsule (100 mg total) by mouth every 8 (eight) hours. 21 capsule 0   . dextromethorphan-guaiFENesin (MUCINEX DM) 30-600 MG 12hr tablet Take 1 tablet by mouth 2 (two) times daily. 15 tablet 0   . etonogestrel-ethinyl estradiol (NUVARING) 0.12-0.015 MG/24HR vaginal ring INSERT 1 RING VAGINALLY FOR 3 WEEKS REMOVE FOR 1 WEEK AND REPEAT 3 each 3   . ibuprofen (ADVIL,MOTRIN) 600 MG tablet Take 1 tablet (600 mg total) by mouth every 6 (six) hours as needed. 30 tablet 0     Review of Systems  Constitutional: Negative for chills and fever.  Respiratory: Negative for cough and shortness of breath.   Gastrointestinal: Positive for nausea and vomiting (None since arrival). Negative for constipation and diarrhea.  Genitourinary: Negative for difficulty urinating, dysuria, vaginal bleeding and vaginal discharge.  Neurological: Positive for headaches (8/10). Negative for dizziness and light-headedness.   Physical Exam   Blood pressure 123/67, pulse 100, temperature 98.9  F (37.2 C), temperature source Oral, resp. rate 20, height 5\' 3"  (1.6 m), weight 64.8 kg, last menstrual period 10/30/2019, SpO2 100 %.  Physical Exam  Constitutional: She is oriented to person, place, and time. No distress.  HENT:  Head: Normocephalic and atraumatic.  Eyes: Conjunctivae are normal.  Cardiovascular: Normal rate, regular rhythm and normal heart sounds.  Respiratory: Effort normal and breath sounds normal. No respiratory distress.  GI: Soft. Normal appearance and bowel sounds are normal. She exhibits no distension. There is no abdominal tenderness.  Musculoskeletal:        General: Normal range of motion.     Cervical back: Normal range of motion.  Neurological: She is alert and oriented to person, place, and time.  Psychiatric: Her behavior is normal. Mood and thought content normal.    MAU Course  Procedures Results for  orders placed or performed during the hospital encounter of 12/15/19 (from the past 24 hour(s))  Pregnancy, urine POC     Status: Abnormal   Collection Time: 12/15/19  1:59 PM  Result Value Ref Range   Preg Test, Ur POSITIVE (A) NEGATIVE  Urinalysis, Routine w reflex microscopic     Status: None   Collection Time: 12/15/19  2:02 PM  Result Value Ref Range   Color, Urine YELLOW YELLOW   APPearance CLEAR CLEAR   Specific Gravity, Urine 1.013 1.005 - 1.030   pH 7.0 5.0 - 8.0   Glucose, UA NEGATIVE NEGATIVE mg/dL   Hgb urine dipstick NEGATIVE NEGATIVE   Bilirubin Urine NEGATIVE NEGATIVE   Ketones, ur NEGATIVE NEGATIVE mg/dL   Protein, ur NEGATIVE NEGATIVE mg/dL   Nitrite NEGATIVE NEGATIVE   Leukocytes,Ua NEGATIVE NEGATIVE    MDM Education Anti-emetic: Oral Labs: UA Assessment and Plan  24 year old G2P0010 at 6.4 weeks Nausea  -Reviewed POC with patient. -Discussed usage of Zofran for quick resolution of nausea. -Informed that will not send home for usage until after 10 weeks. -Patient informed that pain medication for HA could be considered after resolution of nausea. -Patient agreeable and without questions or concerns. -Will reassess   30 12/15/2019, 2:42 PM   Reassessment (3:42 PM) -Patient reports improvement in nausea with zofran dosing. -Patient declines medication for HA treatment. -Will send script for Diclegis; instructions given.  -Discussed need for bland diet and advance as tolerated. -Encouraged to start prenatal care.  -Encouraged to call or return to MAU if symptoms worsen or with the onset of new symptoms. -Discharged to home in improved condition.  12/17/2019 MSN, CNM Advanced Practice Provider, Center for [facility]

## 2019-12-30 DIAGNOSIS — Z419 Encounter for procedure for purposes other than remedying health state, unspecified: Secondary | ICD-10-CM | POA: Diagnosis not present

## 2020-01-24 ENCOUNTER — Other Ambulatory Visit: Payer: Self-pay | Admitting: Nurse Practitioner

## 2020-01-24 DIAGNOSIS — O09899 Supervision of other high risk pregnancies, unspecified trimester: Secondary | ICD-10-CM | POA: Diagnosis not present

## 2020-01-24 DIAGNOSIS — F17210 Nicotine dependence, cigarettes, uncomplicated: Secondary | ICD-10-CM | POA: Diagnosis not present

## 2020-01-24 DIAGNOSIS — Z3481 Encounter for supervision of other normal pregnancy, first trimester: Secondary | ICD-10-CM | POA: Diagnosis not present

## 2020-01-24 DIAGNOSIS — Z3A12 12 weeks gestation of pregnancy: Secondary | ICD-10-CM

## 2020-01-24 DIAGNOSIS — Z3682 Encounter for antenatal screening for nuchal translucency: Secondary | ICD-10-CM

## 2020-01-30 DIAGNOSIS — Z419 Encounter for procedure for purposes other than remedying health state, unspecified: Secondary | ICD-10-CM | POA: Diagnosis not present

## 2020-01-31 ENCOUNTER — Other Ambulatory Visit: Payer: Self-pay

## 2020-01-31 ENCOUNTER — Other Ambulatory Visit (INDEPENDENT_AMBULATORY_CARE_PROVIDER_SITE_OTHER): Payer: Self-pay | Admitting: *Deleted

## 2020-01-31 ENCOUNTER — Other Ambulatory Visit: Payer: Medicaid Other

## 2020-01-31 ENCOUNTER — Ambulatory Visit: Payer: Medicaid Other | Attending: Obstetrics and Gynecology

## 2020-01-31 ENCOUNTER — Ambulatory Visit: Payer: Medicaid Other | Admitting: *Deleted

## 2020-01-31 ENCOUNTER — Encounter: Payer: Self-pay | Admitting: *Deleted

## 2020-01-31 ENCOUNTER — Ambulatory Visit: Payer: Medicaid Other

## 2020-01-31 VITALS — BP 108/69 | HR 106 | Wt 146.0 lb

## 2020-01-31 DIAGNOSIS — Z3A12 12 weeks gestation of pregnancy: Secondary | ICD-10-CM | POA: Insufficient documentation

## 2020-01-31 DIAGNOSIS — Z369 Encounter for antenatal screening, unspecified: Secondary | ICD-10-CM | POA: Diagnosis not present

## 2020-01-31 DIAGNOSIS — Z3682 Encounter for antenatal screening for nuchal translucency: Secondary | ICD-10-CM | POA: Diagnosis not present

## 2020-02-01 LAB — MATERNIT21 PLUS CORE+SCA

## 2020-02-04 LAB — MATERNIT21 PLUS CORE+SCA
Fetal Fraction: 9
Monosomy X (Turner Syndrome): NOT DETECTED
Result (T21): NEGATIVE
Trisomy 21 (Down syndrome): NEGATIVE
XXX (Triple X Syndrome): NOT DETECTED
XXY (Klinefelter Syndrome): NOT DETECTED
XYY (Jacobs Syndrome): NOT DETECTED

## 2020-02-07 ENCOUNTER — Telehealth: Payer: Self-pay | Admitting: Genetic Counselor

## 2020-02-07 NOTE — Telephone Encounter (Signed)
I called Ms. Melder to discuss her negative noninvasive prenatal screening (NIPS)/cell free DNA (cfDNA) testing result. Specifically, Ms. Ingle had MaterniT21 testing through [facility]. These negative results demonstrated an expected representation of chromosome 21, 49, 13, and sex chromosome material, greatly reducing the likelihood of trisomies 26, 21, or 50 and sex chromosome aneuploidies for the pregnancy. Ms. Saksa requested to know about the expected fetal sex, which is female.  NIPS analyzes placental (fetal) DNA in maternal circulation. NIPS is considered to be highly specific and sensitive, but is not considered to be a diagnostic test. We reviewed that this testing identifies 91-99% of pregnancies with trisomies 66, 38, and 24, as well as sex chromosome abnormalities, but does not test for all genetic conditions. Diagnostic testing via amniocentesis is available any time after 16 weeks' gestation should she be interested in confirming this result. She confirmed that she had no questions about these results at this time.  Gershon Crane, MS, [facility] Genetic Counselor

## 2020-02-21 DIAGNOSIS — Z3A16 16 weeks gestation of pregnancy: Secondary | ICD-10-CM | POA: Diagnosis not present

## 2020-02-21 DIAGNOSIS — Z363 Encounter for antenatal screening for malformations: Secondary | ICD-10-CM | POA: Diagnosis not present

## 2020-03-01 DIAGNOSIS — Z419 Encounter for procedure for purposes other than remedying health state, unspecified: Secondary | ICD-10-CM | POA: Diagnosis not present

## 2020-03-20 DIAGNOSIS — O99019 Anemia complicating pregnancy, unspecified trimester: Secondary | ICD-10-CM | POA: Diagnosis not present

## 2020-03-20 DIAGNOSIS — Z3482 Encounter for supervision of other normal pregnancy, second trimester: Secondary | ICD-10-CM | POA: Diagnosis not present

## 2020-03-20 DIAGNOSIS — O99320 Drug use complicating pregnancy, unspecified trimester: Secondary | ICD-10-CM | POA: Diagnosis not present

## 2020-03-20 DIAGNOSIS — O09899 Supervision of other high risk pregnancies, unspecified trimester: Secondary | ICD-10-CM | POA: Diagnosis not present

## 2020-03-20 DIAGNOSIS — R519 Headache, unspecified: Secondary | ICD-10-CM | POA: Diagnosis not present

## 2020-03-20 DIAGNOSIS — F17210 Nicotine dependence, cigarettes, uncomplicated: Secondary | ICD-10-CM | POA: Diagnosis not present

## 2020-03-20 DIAGNOSIS — Z3481 Encounter for supervision of other normal pregnancy, first trimester: Secondary | ICD-10-CM | POA: Diagnosis not present

## 2020-03-20 DIAGNOSIS — O99212 Obesity complicating pregnancy, second trimester: Secondary | ICD-10-CM | POA: Diagnosis not present

## 2020-03-22 ENCOUNTER — Inpatient Hospital Stay (HOSPITAL_COMMUNITY)
Admission: AD | Admit: 2020-03-22 | Discharge: 2020-03-22 | Disposition: A | Discharge status: Home / Self Care | Payer: Medicaid Other | Attending: Obstetrics and Gynecology | Admitting: Obstetrics and Gynecology

## 2020-03-22 DIAGNOSIS — T50Z95A Adverse effect of other vaccines and biological substances, initial encounter: Secondary | ICD-10-CM

## 2020-03-22 DIAGNOSIS — Z20822 Contact with and (suspected) exposure to covid-19: Secondary | ICD-10-CM | POA: Insufficient documentation

## 2020-03-22 DIAGNOSIS — R05 Cough: Secondary | ICD-10-CM | POA: Diagnosis not present

## 2020-03-22 DIAGNOSIS — O99332 Smoking (tobacco) complicating pregnancy, second trimester: Secondary | ICD-10-CM | POA: Insufficient documentation

## 2020-03-22 DIAGNOSIS — F17210 Nicotine dependence, cigarettes, uncomplicated: Secondary | ICD-10-CM | POA: Insufficient documentation

## 2020-03-22 DIAGNOSIS — R5381 Other malaise: Secondary | ICD-10-CM | POA: Diagnosis not present

## 2020-03-22 DIAGNOSIS — O26892 Other specified pregnancy related conditions, second trimester: Secondary | ICD-10-CM | POA: Insufficient documentation

## 2020-03-22 DIAGNOSIS — O2692 Pregnancy related conditions, unspecified, second trimester: Secondary | ICD-10-CM

## 2020-03-22 DIAGNOSIS — Z3A20 20 weeks gestation of pregnancy: Secondary | ICD-10-CM | POA: Diagnosis not present

## 2020-03-22 DIAGNOSIS — R0602 Shortness of breath: Secondary | ICD-10-CM | POA: Diagnosis not present

## 2020-03-22 LAB — SARS CORONAVIRUS 2 BY RT PCR (HOSPITAL ORDER, PERFORMED IN ~~LOC~~ HOSPITAL LAB): SARS Coronavirus 2: NEGATIVE

## 2020-03-22 NOTE — MAU Provider Note (Signed)
History     CSN: [id number]  Arrival date and time: 03/22/20 1233   First Provider Initiated Contact with Patient 03/22/20 1304      Chief Complaint  Patient presents with  . Shortness of Breath  . Cough   HPI  Molly Hensley is a 24 yo G2P0010 at 19.4 EGA who is presenting today for SOB, cough, and malaise.  She got her COVID vaccine [employer]) on Monday. On Tuesday she felt lightheaded and dizzy at work, was sent home. She also developed a cough and wheezing on Tuesday, with runny nose and coughing up mucous. She didn't sleep well, and has persistent SOB and slight headache.  She works at [employer], and doesn't know if she got exposed there. Her last shift was Tuesday.   OB History    Gravida  2   Para      Term      Preterm      AB  1   Living        SAB      TAB  1   Ectopic      Multiple      Live Births              Past Medical History:  Diagnosis Date  . Asthma    Childhood    Past Surgical History:  Procedure Laterality Date  . INDUCED ABORTION    . TOOTH EXTRACTION      Family History  Problem Relation Age of Onset  . Hypertension Mother   . Epilepsy Mother   . Cancer Maternal Grandmother     Social History   Tobacco Use  . Smoking status: Current Every Day Smoker    Packs/day: 0.50    Types: Cigarettes  . Smokeless tobacco: Never Used  . Tobacco comment: patient   Vaping Use  . Vaping Use: Never used  Substance Use Topics  . Alcohol use: No    Alcohol/week: 0.0 standard drinks  . Drug use: Yes    Types: Marijuana    Comment: occasionally    Allergies: No Known Allergies  Medications Prior to Admission  Medication Sig Dispense Refill Last Dose  . Doxylamine-Pyridoxine 10-10 MG TBEC Take 2 tablets by mouth at bedtime. (Patient not taking: Reported on 01/31/2020) 60 tablet 1   . Prenatal Vit-Fe Fumarate-FA (PRENATAL VITAMINS PO) Take by mouth.       Review of Systems  All other systems reviewed and are negative.  Physical  Exam   Blood pressure 113/69, pulse (!) 105, temperature 98.8 F (37.1 C), temperature source Oral, resp. rate (!) 26, height 5\' 3"  (1.6 m), weight 76.3 kg, last menstrual period 10/30/2019, SpO2 100 %.  Physical Exam Vitals and nursing note reviewed.  Constitutional:      Appearance: She is well-developed.  HENT:     Head: Normocephalic and atraumatic.     Mouth/Throat:     Mouth: Mucous membranes are moist.     Pharynx: Oropharynx is clear.     Comments: Post nasal drip noted Eyes:     Pupils: Pupils are equal, round, and reactive to light.  Cardiovascular:     Rate and Rhythm: Normal rate and regular rhythm.  Pulmonary:     Effort: Pulmonary effort is normal.     Breath sounds: Normal breath sounds.     Comments: Mild intermittent cough Chest:     Chest wall: No mass, deformity or tenderness.  Abdominal:     General: Bowel sounds  are normal.     Palpations: Abdomen is soft.  Musculoskeletal:        General: Normal range of motion.     Cervical back: Normal range of motion.  Skin:    General: Skin is warm and dry.     Capillary Refill: Capillary refill takes 2 to 3 seconds.  Neurological:     General: No focal deficit present.     Mental Status: She is alert.  Psychiatric:        Mood and Affect: Mood normal.     MAU Course  Procedures  MDM - Most likely symptoms are immune response from COVID 19 vaccine - CDC reference page printed - COVID 19 swab obtained, will call patient with results - Fetal heart tones confirmed by doppler  Assessment and Plan  24 y.o G2P0010 at 20.4 EGA presenting with symptoms consistent with immune response from recent COVID19 vaccine - encouraged symptomatic treatment - given CDC handout on COVID19 vaccination and common reactions - given "Medications Safe in Pregnancy" list - return precautions discussed - COVID19 test negative - DC to home  Travonte Byard L Chanita Boden 03/22/2020, 1:07 PM

## 2020-03-22 NOTE — MAU Note (Signed)
Got the covid shot Monday morning. Yesterday afternoon, got real hot, dizzy and light headed.  Was sent home from work.  When she got home she was started feeling hot, SOB and wheezing. Started coughing last night, yellow mucous. Had asthmas as a pre-teen.

## 2020-03-22 NOTE — Discharge Instructions (Signed)

## 2020-03-31 DIAGNOSIS — Z419 Encounter for procedure for purposes other than remedying health state, unspecified: Secondary | ICD-10-CM | POA: Diagnosis not present

## 2020-05-01 DIAGNOSIS — Z419 Encounter for procedure for purposes other than remedying health state, unspecified: Secondary | ICD-10-CM | POA: Diagnosis not present

## 2020-05-15 DIAGNOSIS — R519 Headache, unspecified: Secondary | ICD-10-CM | POA: Diagnosis not present

## 2020-05-15 DIAGNOSIS — O99320 Drug use complicating pregnancy, unspecified trimester: Secondary | ICD-10-CM | POA: Diagnosis not present

## 2020-05-15 DIAGNOSIS — O09899 Supervision of other high risk pregnancies, unspecified trimester: Secondary | ICD-10-CM | POA: Diagnosis not present

## 2020-05-15 DIAGNOSIS — O99019 Anemia complicating pregnancy, unspecified trimester: Secondary | ICD-10-CM | POA: Diagnosis not present

## 2020-05-15 DIAGNOSIS — F17210 Nicotine dependence, cigarettes, uncomplicated: Secondary | ICD-10-CM | POA: Diagnosis not present

## 2020-05-15 DIAGNOSIS — Z3483 Encounter for supervision of other normal pregnancy, third trimester: Secondary | ICD-10-CM | POA: Diagnosis not present

## 2020-05-29 DIAGNOSIS — O99320 Drug use complicating pregnancy, unspecified trimester: Secondary | ICD-10-CM | POA: Diagnosis not present

## 2020-05-29 DIAGNOSIS — O99323 Drug use complicating pregnancy, third trimester: Secondary | ICD-10-CM | POA: Diagnosis not present

## 2020-05-29 DIAGNOSIS — F17210 Nicotine dependence, cigarettes, uncomplicated: Secondary | ICD-10-CM | POA: Diagnosis not present

## 2020-05-29 DIAGNOSIS — O99013 Anemia complicating pregnancy, third trimester: Secondary | ICD-10-CM | POA: Diagnosis not present

## 2020-05-29 DIAGNOSIS — O99019 Anemia complicating pregnancy, unspecified trimester: Secondary | ICD-10-CM | POA: Diagnosis not present

## 2020-05-29 DIAGNOSIS — R519 Headache, unspecified: Secondary | ICD-10-CM | POA: Diagnosis not present

## 2020-05-29 DIAGNOSIS — O09899 Supervision of other high risk pregnancies, unspecified trimester: Secondary | ICD-10-CM | POA: Diagnosis not present

## 2020-05-29 DIAGNOSIS — Z3483 Encounter for supervision of other normal pregnancy, third trimester: Secondary | ICD-10-CM | POA: Diagnosis not present

## 2020-05-31 DIAGNOSIS — Z419 Encounter for procedure for purposes other than remedying health state, unspecified: Secondary | ICD-10-CM | POA: Diagnosis not present

## 2020-06-15 ENCOUNTER — Inpatient Hospital Stay (HOSPITAL_COMMUNITY)
Admission: AD | Admit: 2020-06-15 | Discharge: 2020-06-15 | Disposition: A | Discharge status: Home / Self Care | Payer: Medicaid Other | Attending: Obstetrics and Gynecology | Admitting: Obstetrics and Gynecology

## 2020-06-15 ENCOUNTER — Encounter (HOSPITAL_COMMUNITY): Payer: Self-pay | Admitting: Obstetrics and Gynecology

## 2020-06-15 ENCOUNTER — Other Ambulatory Visit: Payer: Self-pay

## 2020-06-15 DIAGNOSIS — O99613 Diseases of the digestive system complicating pregnancy, third trimester: Secondary | ICD-10-CM | POA: Diagnosis not present

## 2020-06-15 DIAGNOSIS — K529 Noninfective gastroenteritis and colitis, unspecified: Secondary | ICD-10-CM | POA: Diagnosis not present

## 2020-06-15 DIAGNOSIS — O219 Vomiting of pregnancy, unspecified: Secondary | ICD-10-CM

## 2020-06-15 DIAGNOSIS — Z3A32 32 weeks gestation of pregnancy: Secondary | ICD-10-CM | POA: Insufficient documentation

## 2020-06-15 DIAGNOSIS — O99333 Smoking (tobacco) complicating pregnancy, third trimester: Secondary | ICD-10-CM | POA: Diagnosis not present

## 2020-06-15 DIAGNOSIS — R197 Diarrhea, unspecified: Secondary | ICD-10-CM

## 2020-06-15 DIAGNOSIS — F17210 Nicotine dependence, cigarettes, uncomplicated: Secondary | ICD-10-CM | POA: Diagnosis not present

## 2020-06-15 DIAGNOSIS — O26899 Other specified pregnancy related conditions, unspecified trimester: Secondary | ICD-10-CM

## 2020-06-15 HISTORY — DX: Headache, unspecified: R51.9

## 2020-06-15 HISTORY — DX: Chlamydial infection, unspecified: A74.9

## 2020-06-15 LAB — URINALYSIS, ROUTINE W REFLEX MICROSCOPIC
Bilirubin Urine: NEGATIVE
Glucose, UA: NEGATIVE mg/dL
Hgb urine dipstick: NEGATIVE
Ketones, ur: NEGATIVE mg/dL
Leukocytes,Ua: NEGATIVE
Nitrite: NEGATIVE
Protein, ur: NEGATIVE mg/dL
Specific Gravity, Urine: 1.01 (ref 1.005–1.030)
pH: 7 (ref 5.0–8.0)

## 2020-06-15 MED ORDER — ONDANSETRON 4 MG PO TBDP
8.0000 mg | ORAL_TABLET | Freq: Once | ORAL | Status: AC
  Administered 2020-06-15: 8 mg via ORAL
  Filled 2020-06-15: qty 2

## 2020-06-15 MED ORDER — ONDANSETRON 4 MG PO TBDP: 4.0000 mg | ORAL_TABLET | Freq: Three times a day (TID) | ORAL | 1 refills | Status: DC | PRN

## 2020-06-15 NOTE — MAU Note (Signed)
Went out for [place] on Tuesday night, within an hour was not feeling well, started throwing up and having diarrhea. Watery stools, and vomiting has continued.

## 2020-06-15 NOTE — MAU Provider Note (Signed)
History     CSN: [id number]  Arrival date and time: 06/15/20 1035   Event Date/Time   First Provider Initiated Contact with Patient 06/15/20 1117      Chief Complaint  Patient presents with  . Abdominal Pain  . Emesis  . Diarrhea   Molly Hensley is a 24 y.o. G2P0 at [redacted]w[redacted]d who presents to MAU with complaints of emesis and diarrhea. Patient reports that she ate [place] on Tuesday night, since then has been throwing up and having diarrhea. She reports emesis 4-5 times today and diarrhea 4-5 times over the past 24 hours. She reports that she occasionally has abdominal pain usually when she needs to use the restroom and has diarrhea. Otherwise she denies abdominal pain or contractions. +FM. Denies vaginal bleeding and discharge.    OB History    Gravida  2   Para      Term      Preterm      AB  1   Living        SAB      IAB  1   Ectopic      Multiple      Live Births              Past Medical History:  Diagnosis Date  . Asthma    Childhood  . Chlamydia   . Headache     Past Surgical History:  Procedure Laterality Date  . INDUCED ABORTION    . TOOTH EXTRACTION      Family History  Problem Relation Age of Onset  . Hypertension Mother   . Epilepsy Mother   . Heart disease Mother   . Cancer Maternal Grandmother     Social History   Tobacco Use  . Smoking status: Current Every Day Smoker    Packs/day: 0.25    Types: Cigarettes  . Smokeless tobacco: Never Used  . Tobacco comment: cutting down  Vaping Use  . Vaping Use: Never used  Substance Use Topics  . Alcohol use: No    Alcohol/week: 0.0 standard drinks  . Drug use: Not Currently    Types: Marijuana    Comment: not since first trimester    Allergies: No Known Allergies  Medications Prior to Admission  Medication Sig Dispense Refill Last Dose  . Prenatal Vit-Fe Fumarate-FA (PRENATAL VITAMINS PO) Take by mouth.   06/15/2020 at Unknown time  . Doxylamine-Pyridoxine 10-10 MG TBEC  Take 2 tablets by mouth at bedtime. (Patient not taking: No sig reported) 60 tablet 1     Review of Systems  Constitutional: Negative.   Respiratory: Negative.   Cardiovascular: Negative.   Gastrointestinal: Positive for abdominal pain, diarrhea and vomiting.  Genitourinary: Negative.   Musculoskeletal: Negative.   Neurological: Negative.   Psychiatric/Behavioral: Negative.    Physical Exam   Blood pressure 113/71, pulse 91, temperature 98.7 F (37.1 C), temperature source Oral, resp. rate 16, height 5\' 3"  (1.6 m), weight 86.1 kg, last menstrual period 10/30/2019, SpO2 99 %.  Physical Exam Vitals and nursing note reviewed.  HENT:     Head: Normocephalic.  Cardiovascular:     Rate and Rhythm: Normal rate and regular rhythm.  Pulmonary:     Effort: Pulmonary effort is normal. No respiratory distress.     Breath sounds: Normal breath sounds. No wheezing.  Abdominal:     Palpations: Abdomen is soft. There is no mass.     Tenderness: There is no abdominal tenderness. There is no guarding.  Comments: Gravid appropriate for gestational age  Skin:    General: Skin is warm and dry.  Neurological:     Mental Status: She is alert and oriented to person, place, and time.  Psychiatric:        Mood and Affect: Mood normal.        Behavior: Behavior normal.        Thought Content: Thought content normal.    Fetal monitoring:  FHR- 135/moderate/+accels/ no decelerations  Toco - no UC   MAU Course  Procedures  MDM UA - noted no dehydration or ketones  Results for orders placed or performed during the hospital encounter of 06/15/20 (from the past 24 hour(s))  Urinalysis, Routine w reflex microscopic Urine, Clean Catch     Status: None   Collection Time: 06/15/20 11:14 AM  Result Value Ref Range   Color, Urine YELLOW YELLOW   APPearance CLEAR CLEAR   Specific Gravity, Urine 1.010 1.005 - 1.030   pH 7.0 5.0 - 8.0   Glucose, UA NEGATIVE NEGATIVE mg/dL   Hgb urine dipstick  NEGATIVE NEGATIVE   Bilirubin Urine NEGATIVE NEGATIVE   Ketones, ur NEGATIVE NEGATIVE mg/dL   Protein, ur NEGATIVE NEGATIVE mg/dL   Nitrite NEGATIVE NEGATIVE   Leukocytes,Ua NEGATIVE NEGATIVE   Oral hydration and ODT zofran ordered. No emesis or diarrhea occurred while in MAU.  Educated and discussed with patient that gastroenteritis usually takes at least 48 hours to pass and needs time to pass. Rx for zofran sent to pharmacy of choice for home use.   Discussed reasons to return to MAU. NST reactive and reassuring. Follow up as scheduled in the office. Return to MAU as needed. Pt stable at time of discharge.   Assessment and Plan   1. Gastroenteritis, acute   2. Nausea and vomiting in pregnancy   3. Diarrhea during pregnancy    Discharge home Follow up as scheduled in the office for prenatal care Return to MAU as needed for reasons discussed and/or emergencies  Hydration and FKC  Rx for zofran    Follow-up Information    Department, [facility] Follow up.   Why: Follow up as scheduled  Contact information: [address] [place] [address] [phone]              Allergies as of 06/15/2020   No Known Allergies     Medication List    TAKE these medications   Doxylamine-Pyridoxine 10-10 MG Tbec Take 2 tablets by mouth at bedtime.   ondansetron 4 MG disintegrating tablet Commonly known as: Zofran ODT Take 1 tablet (4 mg total) by mouth every 8 (eight) hours as needed for nausea or vomiting.   PRENATAL VITAMINS PO Take by mouth.       Sharyon Cable CNM 06/15/2020, 1:10 PM

## 2020-06-15 NOTE — MAU Note (Signed)
Sharp pains tend to coincide with diarrhea bouts.

## 2020-07-01 DIAGNOSIS — Z419 Encounter for procedure for purposes other than remedying health state, unspecified: Secondary | ICD-10-CM | POA: Diagnosis not present

## 2020-07-01 NOTE — L&D Delivery Note (Addendum)
OB/GYN Faculty Practice Delivery Note  Molly Hensley is a 25 y.o. G2P0010 at [redacted]w[redacted]d. She was admitted for IOL d/t gHTN-->preE w/ SF.  ROM: 15h 37m with clear fluid GBS Status: neg Maximum Maternal Temperature: 100.9*F  Labor Progress: . Patient induced with cytotec x1 and FB where she progressed to 4 cm dilated. She then started pitocin. AROM at 1130 on 1/20 for clear and IUPC placed, subsequently patient had a FHT deceleration and pitocin was held from noon to 1 PM, and restarted. Patient then progressed to complete.  Delivery Date/Time: 07/20/2020 at 0324 Delivery: Called to room and patient was complete and pushing. Head delivered ROA. No nuchal cord present. Shoulder and body delivered in usual fashion. Infant with spontaneous cry, placed on mother's abdomen, dried and stimulated. Cord clamped x 2 after 1-minute delay, and cut by MGM. Cord blood drawn. Placenta delivered spontaneously with gentle cord traction. Fundus firm with massage and Pitocin. Post placental liletta placed, please refer to procedure note for details. Labia, perineum, vagina, and cervix inspected with a left sulcal laceration which was repaired with a figure of 8 stitch.  Placenta: spontaneous, 3VC, intact, sent to pathology in the setting of maternal fever Complications: none Lacerations: left sulcal laceration EBL: 253cc Analgesia: epidural  Infant: female  APGARs 8,9  weight per medical chart  to newborn/skin to skin  Shirlean Mylar, MD [facility] Family Medicine, PGY-1 07/20/2020, 3:50 AM   GME ATTESTATION:  I saw and evaluated the patient. I agree with the findings and the plan of care as documented in the resident's note.  Alric Seton, MD OB Fellow, Faculty [facility], Center for [facility] 07/20/2020 4:41 AM

## 2020-07-10 DIAGNOSIS — G44211 Episodic tension-type headache, intractable: Secondary | ICD-10-CM | POA: Diagnosis not present

## 2020-07-10 DIAGNOSIS — Z3403 Encounter for supervision of normal first pregnancy, third trimester: Secondary | ICD-10-CM | POA: Diagnosis not present

## 2020-07-10 DIAGNOSIS — Z3483 Encounter for supervision of other normal pregnancy, third trimester: Secondary | ICD-10-CM | POA: Diagnosis not present

## 2020-07-10 DIAGNOSIS — O99013 Anemia complicating pregnancy, third trimester: Secondary | ICD-10-CM | POA: Diagnosis not present

## 2020-07-10 DIAGNOSIS — O09899 Supervision of other high risk pregnancies, unspecified trimester: Secondary | ICD-10-CM | POA: Diagnosis not present

## 2020-07-10 DIAGNOSIS — O99323 Drug use complicating pregnancy, third trimester: Secondary | ICD-10-CM | POA: Diagnosis not present

## 2020-07-10 DIAGNOSIS — F17210 Nicotine dependence, cigarettes, uncomplicated: Secondary | ICD-10-CM | POA: Diagnosis not present

## 2020-07-18 ENCOUNTER — Encounter (HOSPITAL_COMMUNITY): Payer: Self-pay | Admitting: Obstetrics and Gynecology

## 2020-07-18 ENCOUNTER — Inpatient Hospital Stay (HOSPITAL_COMMUNITY)
Admission: AD | Admit: 2020-07-18 | Discharge: 2020-07-22 | DRG: 805 | Disposition: A | Discharge status: Home / Self Care | Payer: Medicaid Other | Attending: Obstetrics and Gynecology | Admitting: Obstetrics and Gynecology

## 2020-07-18 ENCOUNTER — Inpatient Hospital Stay (HOSPITAL_COMMUNITY): Payer: Medicaid Other | Admitting: Anesthesiology

## 2020-07-18 ENCOUNTER — Other Ambulatory Visit: Payer: Self-pay

## 2020-07-18 DIAGNOSIS — O0990 Supervision of high risk pregnancy, unspecified, unspecified trimester: Secondary | ICD-10-CM

## 2020-07-18 DIAGNOSIS — O99334 Smoking (tobacco) complicating childbirth: Secondary | ICD-10-CM | POA: Diagnosis present

## 2020-07-18 DIAGNOSIS — J45909 Unspecified asthma, uncomplicated: Secondary | ICD-10-CM | POA: Diagnosis not present

## 2020-07-18 DIAGNOSIS — Z30430 Encounter for insertion of intrauterine contraceptive device: Secondary | ICD-10-CM | POA: Diagnosis not present

## 2020-07-18 DIAGNOSIS — D62 Acute posthemorrhagic anemia: Secondary | ICD-10-CM | POA: Diagnosis not present

## 2020-07-18 DIAGNOSIS — O411230 Chorioamnionitis, third trimester, not applicable or unspecified: Secondary | ICD-10-CM | POA: Diagnosis present

## 2020-07-18 DIAGNOSIS — O1414 Severe pre-eclampsia complicating childbirth: Principal | ICD-10-CM | POA: Diagnosis present

## 2020-07-18 DIAGNOSIS — Z20822 Contact with and (suspected) exposure to covid-19: Secondary | ICD-10-CM | POA: Diagnosis present

## 2020-07-18 DIAGNOSIS — O9081 Anemia of the puerperium: Secondary | ICD-10-CM | POA: Diagnosis not present

## 2020-07-18 DIAGNOSIS — Z3A37 37 weeks gestation of pregnancy: Secondary | ICD-10-CM | POA: Diagnosis not present

## 2020-07-18 DIAGNOSIS — F17210 Nicotine dependence, cigarettes, uncomplicated: Secondary | ICD-10-CM | POA: Diagnosis present

## 2020-07-18 DIAGNOSIS — F17200 Nicotine dependence, unspecified, uncomplicated: Secondary | ICD-10-CM | POA: Diagnosis present

## 2020-07-18 DIAGNOSIS — Z3483 Encounter for supervision of other normal pregnancy, third trimester: Secondary | ICD-10-CM | POA: Diagnosis not present

## 2020-07-18 DIAGNOSIS — O134 Gestational [pregnancy-induced] hypertension without significant proteinuria, complicating childbirth: Secondary | ICD-10-CM | POA: Diagnosis not present

## 2020-07-18 DIAGNOSIS — O411290 Chorioamnionitis, unspecified trimester, not applicable or unspecified: Secondary | ICD-10-CM | POA: Diagnosis not present

## 2020-07-18 DIAGNOSIS — Z975 Presence of (intrauterine) contraceptive device: Secondary | ICD-10-CM

## 2020-07-18 DIAGNOSIS — O9952 Diseases of the respiratory system complicating childbirth: Secondary | ICD-10-CM | POA: Diagnosis not present

## 2020-07-18 DIAGNOSIS — O1490 Unspecified pre-eclampsia, unspecified trimester: Secondary | ICD-10-CM | POA: Diagnosis present

## 2020-07-18 DIAGNOSIS — D649 Anemia, unspecified: Secondary | ICD-10-CM | POA: Diagnosis present

## 2020-07-18 LAB — COMPREHENSIVE METABOLIC PANEL
ALT: 18 U/L (ref 0–44)
AST: 22 U/L (ref 15–41)
Albumin: 2.8 g/dL — ABNORMAL LOW (ref 3.5–5.0)
Alkaline Phosphatase: 103 U/L (ref 38–126)
Anion gap: 9 (ref 5–15)
BUN: 7 mg/dL (ref 6–20)
CO2: 18 mmol/L — ABNORMAL LOW (ref 22–32)
Calcium: 9 mg/dL (ref 8.9–10.3)
Chloride: 106 mmol/L (ref 98–111)
Creatinine, Ser: 0.61 mg/dL (ref 0.44–1.00)
GFR, Estimated: >60 mL/min (ref >60)
Glucose, Bld: 85 mg/dL (ref 70–99)
Potassium: 3.2 mmol/L — ABNORMAL LOW (ref 3.5–5.1)
Sodium: 133 mmol/L — ABNORMAL LOW (ref 135–145)
Total Bilirubin: 0.5 mg/dL (ref 0.3–1.2)
Total Protein: 6.6 g/dL (ref 6.5–8.1)

## 2020-07-18 LAB — CBC
HCT: 27.4 % — ABNORMAL LOW (ref 36.0–46.0)
HCT: 27.1 % — ABNORMAL LOW (ref 36.0–46.0)
Hemoglobin: 9.3 g/dL — ABNORMAL LOW (ref 12.0–15.0)
Hemoglobin: 8.9 g/dL — ABNORMAL LOW (ref 12.0–15.0)
MCH: 31.4 pg (ref 26.0–34.0)
MCH: 30.7 pg (ref 26.0–34.0)
MCHC: 33.9 g/dL (ref 30.0–36.0)
MCHC: 32.8 g/dL (ref 30.0–36.0)
MCV: 92.6 fL (ref 80.0–100.0)
MCV: 93.4 fL (ref 80.0–100.0)
Platelets: 181 10*3/uL (ref 150–400)
Platelets: 162 10*3/uL (ref 150–400)
RBC: 2.96 MIL/uL — ABNORMAL LOW (ref 3.87–5.11)
RBC: 2.9 MIL/uL — ABNORMAL LOW (ref 3.87–5.11)
RDW: 12.9 % (ref 11.5–15.5)
RDW: 13 % (ref 11.5–15.5)
WBC: 9.4 10*3/uL (ref 4.0–10.5)
WBC: 11.1 10*3/uL — ABNORMAL HIGH (ref 4.0–10.5)
nRBC: 0 % (ref 0.0–0.2)
nRBC: 0 % (ref 0.0–0.2)

## 2020-07-18 LAB — PROTEIN / CREATININE RATIO, URINE
Creatinine, Urine: 139.38 mg/dL
Protein Creatinine Ratio: 0.17 mg/mg{Cre} — ABNORMAL HIGH (ref 0.00–0.15)
Total Protein, Urine: 24 mg/dL

## 2020-07-18 LAB — TYPE AND SCREEN
ABO/RH(D): A POS
Antibody Screen: NEGATIVE

## 2020-07-18 LAB — SARS CORONAVIRUS 2 (TAT 6-24 HRS): SARS Coronavirus 2: NEGATIVE

## 2020-07-18 MED ORDER — FENTANYL CITRATE (PF) 100 MCG/2ML IJ SOLN
INTRAMUSCULAR | Status: AC
  Administered 2020-07-18: 100 ug
  Filled 2020-07-18: qty 2

## 2020-07-18 MED ORDER — FENTANYL CITRATE (PF) 100 MCG/2ML IJ SOLN
100.0000 ug | INTRAMUSCULAR | Status: DC | PRN
  Administered 2020-07-18 (×2): 100 ug via INTRAVENOUS
  Filled 2020-07-18 (×3): qty 2

## 2020-07-18 MED ORDER — LEVONORGESTREL 19.5 MCG/DAY IU IUD
INTRAUTERINE_SYSTEM | Freq: Once | INTRAUTERINE | Status: AC
  Administered 2020-07-20: 1 via INTRAUTERINE
  Filled 2020-07-18: qty 1

## 2020-07-18 MED ORDER — OXYCODONE-ACETAMINOPHEN 5-325 MG PO TABS
1.0000 | ORAL_TABLET | ORAL | Status: DC | PRN
  Administered 2020-07-20: 1 via ORAL
  Filled 2020-07-18: qty 1

## 2020-07-18 MED ORDER — LACTATED RINGERS IV SOLN: 500.0000 mL | Freq: Once | INTRAVENOUS | Status: DC

## 2020-07-18 MED ORDER — ONDANSETRON HCL 4 MG/2ML IJ SOLN: 4.0000 mg | Freq: Four times a day (QID) | INTRAMUSCULAR | Status: DC | PRN

## 2020-07-18 MED ORDER — LACTATED RINGERS IV SOLN
500.0000 mL | INTRAVENOUS | Status: DC | PRN
  Administered 2020-07-19: 1000 mL via INTRAVENOUS

## 2020-07-18 MED ORDER — LIDOCAINE HCL (PF) 1 % IJ SOLN: 30.0000 mL | INTRAMUSCULAR | Status: DC | PRN

## 2020-07-18 MED ORDER — SOD CITRATE-CITRIC ACID 500-334 MG/5ML PO SOLN
30.0000 mL | ORAL | Status: DC | PRN
  Administered 2020-07-19 (×2): 30 mL via ORAL
  Filled 2020-07-18 (×2): qty 15

## 2020-07-18 MED ORDER — EPHEDRINE 5 MG/ML INJ: 10.0000 mg | INTRAVENOUS | Status: DC | PRN

## 2020-07-18 MED ORDER — LIDOCAINE HCL (PF) 1 % IJ SOLN
INTRAMUSCULAR | Status: DC | PRN
  Administered 2020-07-18: 2 mL via EPIDURAL
  Administered 2020-07-18: 10 mL via EPIDURAL

## 2020-07-18 MED ORDER — ACETAMINOPHEN 325 MG PO TABS: 650.0000 mg | ORAL_TABLET | ORAL | Status: DC | PRN

## 2020-07-18 MED ORDER — TERBUTALINE SULFATE 1 MG/ML IJ SOLN: 0.2500 mg | Freq: Once | INTRAMUSCULAR | Status: DC | PRN

## 2020-07-18 MED ORDER — DIPHENHYDRAMINE HCL 50 MG/ML IJ SOLN: 12.5000 mg | INTRAMUSCULAR | Status: DC | PRN

## 2020-07-18 MED ORDER — SODIUM CHLORIDE (PF) 0.9 % IJ SOLN
INTRAMUSCULAR | Status: DC | PRN
  Administered 2020-07-18: 12 mL/h via EPIDURAL

## 2020-07-18 MED ORDER — OXYCODONE-ACETAMINOPHEN 5-325 MG PO TABS: 2.0000 | ORAL_TABLET | ORAL | Status: DC | PRN

## 2020-07-18 MED ORDER — PHENYLEPHRINE 40 MCG/ML (10ML) SYRINGE FOR IV PUSH (FOR BLOOD PRESSURE SUPPORT)
80.0000 ug | PREFILLED_SYRINGE | INTRAVENOUS | Status: DC | PRN
  Filled 2020-07-18: qty 10

## 2020-07-18 MED ORDER — TERBUTALINE SULFATE 1 MG/ML IJ SOLN
0.2500 mg | Freq: Once | INTRAMUSCULAR | Status: AC | PRN
  Administered 2020-07-19: 0.25 mg via SUBCUTANEOUS
  Filled 2020-07-18: qty 1

## 2020-07-18 MED ORDER — LACTATED RINGERS IV SOLN: INTRAVENOUS | Status: DC

## 2020-07-18 MED ORDER — OXYTOCIN-SODIUM CHLORIDE 30-0.9 UT/500ML-% IV SOLN
1.0000 m[IU]/min | INTRAVENOUS | Status: DC
  Administered 2020-07-18: 2 m[IU]/min via INTRAVENOUS

## 2020-07-18 MED ORDER — FENTANYL-BUPIVACAINE-NACL 0.5-0.125-0.9 MG/250ML-% EP SOLN
12.0000 mL/h | EPIDURAL | Status: DC | PRN
  Administered 2020-07-19: 12 mL/h via EPIDURAL
  Filled 2020-07-18 (×2): qty 250

## 2020-07-18 MED ORDER — PHENYLEPHRINE 40 MCG/ML (10ML) SYRINGE FOR IV PUSH (FOR BLOOD PRESSURE SUPPORT): 80.0000 ug | PREFILLED_SYRINGE | INTRAVENOUS | Status: DC | PRN

## 2020-07-18 MED ORDER — OXYTOCIN BOLUS FROM INFUSION
333.0000 mL | Freq: Once | INTRAVENOUS | Status: AC
  Administered 2020-07-20: 333 mL via INTRAVENOUS

## 2020-07-18 MED ORDER — MISOPROSTOL 50MCG HALF TABLET
50.0000 ug | ORAL_TABLET | Freq: Once | ORAL | Status: AC
  Administered 2020-07-18: 50 ug via BUCCAL
  Filled 2020-07-18: qty 1

## 2020-07-18 MED ORDER — OXYTOCIN-SODIUM CHLORIDE 30-0.9 UT/500ML-% IV SOLN
2.5000 [IU]/h | INTRAVENOUS | Status: DC
  Filled 2020-07-18 (×2): qty 500
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
## 2020-07-18 NOTE — Anesthesia Preprocedure Evaluation (Signed)
Anesthesia Evaluation  Patient identified by MRN, date of birth, ID band Patient awake    Reviewed: Allergy & Precautions, Patient's Chart, lab work & pertinent test results  Airway Mallampati: II  TM Distance: >3 FB Neck ROM: Full    Dental no notable dental hx.    Pulmonary asthma , Current Smoker,    Pulmonary exam normal breath sounds clear to auscultation       Cardiovascular negative cardio ROS Normal cardiovascular exam Rhythm:Regular Rate:Normal     Neuro/Psych  Headaches,    GI/Hepatic negative GI ROS, Neg liver ROS,   Endo/Other  Obesity BMI 34  Renal/GU negative Renal ROS     Musculoskeletal negative musculoskeletal ROS (+)   Abdominal   Peds  Hematology  (+) Blood dyscrasia, anemia , hct 27.1, plt 162   Anesthesia Other Findings   Reproductive/Obstetrics (+) Pregnancy                             Anesthesia Physical Anesthesia Plan  ASA: II and emergent  Anesthesia Plan: Epidural   Post-op Pain Management:    Induction:   PONV Risk Score and Plan: 2  Airway Management Planned: Natural Airway  Additional Equipment: None  Intra-op Plan:   Post-operative Plan:   Informed Consent: I have reviewed the patients History and Physical, chart, labs and discussed the procedure including the risks, benefits and alternatives for the proposed anesthesia with the patient or authorized representative who has indicated his/her understanding and acceptance.       Plan Discussed with:   Anesthesia Plan Comments:         Anesthesia Quick Evaluation

## 2020-07-18 NOTE — Progress Notes (Signed)
Labor Progress Note Molly Hensley is a 25 y.o. G2P0010 at [redacted]w[redacted]d presented for IOL-gHTN. S: Doing well without complaints. Feeling contractions.  O:  BP (!) 147/105   Pulse 98   Temp 98.4 F (36.9 C) (Oral)   Resp 18   Ht 5\' 3"  (1.6 m)   Wt 87.7 kg   LMP 10/30/2019 (Exact Date)   BMI 34.26 kg/m  EFM: q130bpm/mod variability/+ accels/no decels Toco: q1 min  CVE: Dilation: Closed   A&P: 25 y.o. G2P0010 [redacted]w[redacted]d presented IOL-gHTN. #IOL: S/p cyto and FB placement ~1600. FB still in place. Will defer re-dosing cytotec given frequency of contractions. Will continue to monitor. #gHTN: BP stable. PreE labs normal. Asymptomatic. Continue to monitor. #Pain: PRN, desires epidural #FWB: cat 1 #GBS negative #contraception: desires post placental liletta, previously ordered/consented.  #anemia: admit hgb 9.3, iron postpartum. IV vs PO pending EBL.  [redacted]w[redacted]d, MD 9:45 PM

## 2020-07-18 NOTE — Anesthesia Procedure Notes (Signed)
Epidural Patient location during procedure: OB Start time: 07/18/2020 11:22 PM End time: 07/18/2020 11:30 PM  Staffing Anesthesiologist: Lannie Fields, DO Performed: anesthesiologist   Preanesthetic Checklist Completed: patient identified, IV checked, risks and benefits discussed, monitors and equipment checked, pre-op evaluation and timeout performed  Epidural Patient position: sitting Prep: DuraPrep and site prepped and draped Patient monitoring: continuous pulse ox, blood pressure, heart rate and cardiac monitor Approach: midline Location: L3-L4 Injection technique: LOR air  Needle:  Needle type: Tuohy  Needle gauge: 17 G Needle length: 9 cm Needle insertion depth: 6 cm Catheter type: closed end flexible Catheter size: 19 Gauge Catheter at skin depth: 11 cm Test dose: negative  Assessment Sensory level: T8 Events: blood not aspirated, injection not painful, no injection resistance, no paresthesia and negative IV test  Additional Notes Patient identified. Risks/Benefits/Options discussed with patient including but not limited to bleeding, infection, nerve damage, paralysis, failed block, incomplete pain control, headache, blood pressure changes, nausea, vomiting, reactions to medication both or allergic, itching and postpartum back pain. Confirmed with bedside nurse the patient's most recent platelet count. Confirmed with patient that they are not currently taking any anticoagulation, have any bleeding history or any family history of bleeding disorders. Patient expressed understanding and wished to proceed. All questions were answered. Sterile technique was used throughout the entire procedure. Please see nursing notes for vital signs. Test dose was given through epidural catheter and negative prior to continuing to dose epidural or start infusion. Warning signs of high block given to the patient including shortness of breath, tingling/numbness in hands, complete motor  block, or any concerning symptoms with instructions to call for help. Patient was given instructions on fall risk and not to get out of bed. All questions and concerns addressed with instructions to call with any issues or inadequate analgesia.  Reason for block:procedure for pain

## 2020-07-18 NOTE — Progress Notes (Signed)
Labor Progress Note Treesa L Stempel is a 25 y.o. G2P0010 at [redacted]w[redacted]d presented for IOL-gHTN. S: Doing well without complaints. Epidural just placed.  O:  BP 126/80   Pulse 87   Temp 98.4 F (36.9 C) (Oral)   Resp 16   Ht 5\' 3"  (1.6 m)   Wt 87.7 kg   LMP 10/30/2019 (Exact Date)   SpO2 99%   BMI 34.26 kg/m  EFM: q130bpm/mod variability/+ accels/no decels Toco: q1-3 min  CVE: Dilation: 3 Effacement (%): 50 Station: -2 Presentation: Vertex Exam by:: Dr [email or web address]   A&P: 25 y.o. G2P0010 [redacted]w[redacted]d presented IOL-gHTN. #IOL: S/p cyto x1 and FB placement ~1600. FB dislodged with this exam. Will start pitocin, continue to titrate. #gHTN: BP stable. PreE labs normal. Asymptomatic. Continue to monitor. #Pain: PRN, desires epidural #FWB: cat 1 #GBS negative #contraception: desires post placental liletta, previously ordered/consented.  #anemia: admit hgb 9.3, iron postpartum. IV vs PO pending EBL.  [redacted]w[redacted]d, MD 11:57 PM

## 2020-07-19 LAB — CBC
HCT: 26.6 % — ABNORMAL LOW (ref 36.0–46.0)
Hemoglobin: 9.1 g/dL — ABNORMAL LOW (ref 12.0–15.0)
MCH: 31.4 pg (ref 26.0–34.0)
MCHC: 34.2 g/dL (ref 30.0–36.0)
MCV: 91.7 fL (ref 80.0–100.0)
Platelets: 151 10*3/uL (ref 150–400)
RBC: 2.9 MIL/uL — ABNORMAL LOW (ref 3.87–5.11)
RDW: 13 % (ref 11.5–15.5)
WBC: 15.4 10*3/uL — ABNORMAL HIGH (ref 4.0–10.5)
nRBC: 0.1 % (ref 0.0–0.2)

## 2020-07-19 LAB — RPR: RPR Ser Ql: NONREACTIVE

## 2020-07-19 LAB — GROUP B STREP BY PCR: Group B strep by PCR: NEGATIVE

## 2020-07-19 MED ORDER — FENTANYL CITRATE (PF) 100 MCG/2ML IJ SOLN
INTRAMUSCULAR | Status: DC | PRN
  Administered 2020-07-19: 100 ug via EPIDURAL

## 2020-07-19 MED ORDER — LIDOCAINE-EPINEPHRINE (PF) 2 %-1:200000 IJ SOLN
INTRAMUSCULAR | Status: DC | PRN
  Administered 2020-07-19 (×2): 5 mL via INTRADERMAL

## 2020-07-19 MED ORDER — BUPIVACAINE HCL (PF) 0.25 % IJ SOLN
INTRAMUSCULAR | Status: DC | PRN
  Administered 2020-07-19: 6 mL via EPIDURAL

## 2020-07-19 NOTE — Progress Notes (Signed)
Labor Progress Note Molly Hensley is a 25 y.o. G2P0010 at [redacted]w[redacted]d presented for IOL-gHTN. S: Sleeping with peanut ball comfortably, epidural in place.  O:  BP 127/79   Pulse 77   Temp 98.5 F (36.9 C) (Oral)   Resp 18   Ht 5\' 3"  (1.6 m)   Wt 87.7 kg   LMP 10/30/2019 (Exact Date)   SpO2 99%   BMI 34.26 kg/m  EFM: q130bpm/mod variability/+ accels/no decels Toco: q3-5 min  CVE: Dilation: 4.5 Effacement (%): 70 Station: -2 Presentation: Vertex Exam by:: Dr [email or web address]   A&P: 25 y.o. G2P0010 [redacted]w[redacted]d presented IOL-gHTN. #IOL: S/p cyto x1 and FB placement ~1600. FB dislodged around 2300. Progressing well with pitocin, now at 30.  #gHTN: BP stable. PreE labs normal. Asymptomatic. Continue to monitor. #Pain: PRN, desires epidural #FWB: cat 1 #GBS negative #contraception: desires post placental liletta, previously ordered/consented.  #anemia: admit hgb 9.3, iron postpartum. IV vs PO pending EBL.  [redacted]w[redacted]d, MD 3:52 AM

## 2020-07-19 NOTE — Progress Notes (Signed)
Molly Hensley is a 25 y.o. G2P0010 at [redacted]w[redacted]d admitted for induction of labor due to [facility].  Subjective: Patient resting comfortably in bed, pain managed by epidural. Per RN no cervical progression since around 0500 this morning; including her most recent check.  Objective: BP 128/81   Pulse 76   Temp 98.3 F (36.8 C) (Oral)   Resp 17   Ht 5\' 3"  (1.6 m)   Wt 87.7 kg   LMP 10/30/2019 (Exact Date)   SpO2 99%   BMI 34.26 kg/m  I/O last 3 completed shifts: In: -  Out: 3050 [Urine:3050] Total I/O In: -  Out: 1100 [Urine:1100]  FHT:  FHR: 130 bpm, variability: moderate,  accelerations:  Present,  decelerations:  Absent UC:   regular, every 1-5 minutes SVE:   Dilation: 5 Effacement (%): 70 Station: -2 Exam by:: [email or web address], CNM On 18 milli-units of Pitocin AROM with copious amounts of clear fluid in return, patient tolerated well  Labs: Lab Results  Component Value Date   WBC 11.1 (H) 07/18/2020   HGB 8.9 (L) 07/18/2020   HCT 27.1 (L) 07/18/2020   MCV 93.4 07/18/2020   PLT 162 07/18/2020    Assessment / Plan: Induction of labor due to gestational hypertension,  progressing well on pitocin  Labor: Progressing on Pitocin Preeclampsia:  labs stable Fetal Wellbeing:  Category I Pain Control:  Epidural I/D:  n/a Anticipated MOD:  NSVD  07/20/2020, CNM 07/19/2020, 11:35 AM

## 2020-07-19 NOTE — Progress Notes (Signed)
Labor Progress Note Molly Hensley is a 25 y.o. G2P0010 at [redacted]w[redacted]d presented for 07/18/20 for IOL-gHTN. S: Doing well without complaints.  O:  BP (!) 148/89   Pulse 87   Temp 98.3 F (36.8 C) (Axillary)   Resp 17   Ht 5\' 3"  (1.6 m)   Wt 87.7 kg   LMP 10/30/2019 (Exact Date)   SpO2 97%   BMI 34.26 kg/m  EFM: baseline 145 bpm/mod variability/+accels/ intermittent variable decels, quick return to baseline Toco: q1-3 min  CVE: Dilation: 6.5 Effacement (%): 70 Station: -1 Presentation: Vertex Exam by:: [email or web address], RN   A&P: 25 y.o. G2P0010 [redacted]w[redacted]d presented for IOL-gHTN. #IOL: S/p FB and cyto x1. Pitocin started 1/18 @2350  and continued overnight. AROM with clear fluid 1/19 @1130  and IUPC placed. Patient had prolonged decel so pitocin was stopped and restarted at 1300. Currently @14mL /hr. Continue to titrate. #Pain: epidural #FWB: cat 2 but overall reassuring #GBS negative #gHTN: preE labs nml on admit, repeat preE labs pending. Asymptomatic. BP stable. #anemia of pregnancy: last hgb 8.9, IV vs PO iron postpartum pending EBL.  , MD 8:51 PM

## 2020-07-19 NOTE — Progress Notes (Signed)
Molly Hensley is a 25 y.o. G2P0010 at [redacted]w[redacted]d by LMP admitted for induction of labor due to gHTN.  Subjective: Call from RN to come to room with prolonged FHR decel after AROM.  Objective: BP 137/85   Pulse 85   Temp 99 F (37.2 C) (Oral)   Resp 18   Ht 5\' 3"  (1.6 m)   Wt 87.7 kg   LMP 10/30/2019 (Exact Date)   SpO2 97%   BMI 34.26 kg/m  I/O last 3 completed shifts: In: -  Out: 7500 [Urine:7500] No intake/output data recorded.  FHT:  FHR: 140 bpm, variability: moderate,  accelerations:  Abscent,  decelerations:  Present prolonged deceleration in FHR   UC:   regular, every 1.5-2.5 minutes SVE:   Dilation: 5 Effacement (%): 70 Station: -1 Exam by: 12/30/2019, RN Pitocin at 2 milli-units  Labs: Lab Results  Component Value Date   WBC 11.1 (H) 07/18/2020   HGB 8.9 (L) 07/18/2020   HCT 27.1 (L) 07/18/2020   MCV 93.4 07/18/2020   PLT 162 07/18/2020    Assessment / Plan: Induction of labor due to gestational hypertension,  progressing well on pitocin  Labor: Progressing on Pitocin Preeclampsia:  labs stable Fetal Wellbeing:  Category I Pain Control:  Epidural I/D:  n/a Anticipated MOD:  NSVD  Reuven Braver 07/19/2020, 12:00 PM

## 2020-07-19 NOTE — Progress Notes (Signed)
Labor Progress Note Molly Hensley is a 25 y.o. G2P0010 at [redacted]w[redacted]d presented for 07/18/20 for IOL-gHTN. S: Doing well without complaints.  O:  BP 134/89   Pulse 94   Temp 98.3 F (36.8 C) (Axillary)   Resp 17   Ht 5\' 3"  (1.6 m)   Wt 87.7 kg   LMP 10/30/2019 (Exact Date)   SpO2 97%   BMI 34.26 kg/m  EFM: baseline 140 bpm/mod variability/+accels/+late decels that return to baseline Toco: q1-3 min  CVE: Dilation: 7 Effacement (%): 80 Station: 0 Presentation: Vertex Exam by:: [email or web address], RN   A&P: 25 y.o. G2P0010 [redacted]w[redacted]d presented for IOL-gHTN. #IOL: S/p FB and cyto x1. Pitocin started 1/18 @2350  and continued overnight. AROM with clear fluid 1/19 @1130  and IUPC placed. Patient had prolonged decel so pitocin was stopped and restarted at 1300. Currently @14mL /hr, will hold here as she has had several late decels that have responded quickly to position changes. Will continue to titrate pitocin if no further decels and FHT appropriate after 1 hr. #Pain: epidural #FWB: cat 2 but overall reassuring #GBS negative #gHTN: preE labs nml on admit, repeat preE labs pending. Asymptomatic. BP MR. #anemia of pregnancy: last hgb 8.9, IV vs PO iron postpartum pending EBL.  , MD 10:40 PM

## 2020-07-20 ENCOUNTER — Encounter (HOSPITAL_COMMUNITY): Payer: Self-pay | Admitting: Obstetrics and Gynecology

## 2020-07-20 DIAGNOSIS — Z30430 Encounter for insertion of intrauterine contraceptive device: Secondary | ICD-10-CM

## 2020-07-20 DIAGNOSIS — Z3A37 37 weeks gestation of pregnancy: Secondary | ICD-10-CM | POA: Diagnosis not present

## 2020-07-20 DIAGNOSIS — O1414 Severe pre-eclampsia complicating childbirth: Secondary | ICD-10-CM | POA: Diagnosis not present

## 2020-07-20 DIAGNOSIS — Z975 Presence of (intrauterine) contraceptive device: Secondary | ICD-10-CM

## 2020-07-20 DIAGNOSIS — Z3A00 Weeks of gestation of pregnancy not specified: Secondary | ICD-10-CM | POA: Diagnosis not present

## 2020-07-20 DIAGNOSIS — O411230 Chorioamnionitis, third trimester, not applicable or unspecified: Secondary | ICD-10-CM | POA: Diagnosis not present

## 2020-07-20 DIAGNOSIS — O411290 Chorioamnionitis, unspecified trimester, not applicable or unspecified: Secondary | ICD-10-CM | POA: Diagnosis not present

## 2020-07-20 LAB — COMPREHENSIVE METABOLIC PANEL
ALT: 15 U/L (ref 0–44)
AST: 20 U/L (ref 15–41)
Albumin: 2.4 g/dL — ABNORMAL LOW (ref 3.5–5.0)
Alkaline Phosphatase: 101 U/L (ref 38–126)
Anion gap: 7 (ref 5–15)
BUN: <5 mg/dL — ABNORMAL LOW (ref 6–20)
CO2: 20 mmol/L — ABNORMAL LOW (ref 22–32)
Calcium: 8.6 mg/dL — ABNORMAL LOW (ref 8.9–10.3)
Chloride: 106 mmol/L (ref 98–111)
Creatinine, Ser: 0.69 mg/dL (ref 0.44–1.00)
GFR, Estimated: >60 mL/min (ref >60)
Glucose, Bld: 86 mg/dL (ref 70–99)
Potassium: 2.9 mmol/L — ABNORMAL LOW (ref 3.5–5.1)
Sodium: 133 mmol/L — ABNORMAL LOW (ref 135–145)
Total Bilirubin: 1.1 mg/dL (ref 0.3–1.2)
Total Protein: 5.8 g/dL — ABNORMAL LOW (ref 6.5–8.1)

## 2020-07-20 MED ORDER — SENNOSIDES-DOCUSATE SODIUM 8.6-50 MG PO TABS
2.0000 | ORAL_TABLET | ORAL | Status: DC
  Administered 2020-07-20 – 2020-07-21 (×2): 2 via ORAL
  Filled 2020-07-20 (×2): qty 2

## 2020-07-20 MED ORDER — ONDANSETRON HCL 4 MG PO TABS: 4.0000 mg | ORAL_TABLET | ORAL | Status: DC | PRN

## 2020-07-20 MED ORDER — WITCH HAZEL-GLYCERIN EX PADS: 1.0000 "application " | MEDICATED_PAD | CUTANEOUS | Status: DC | PRN

## 2020-07-20 MED ORDER — PRENATAL MULTIVITAMIN CH
1.0000 | ORAL_TABLET | Freq: Every day | ORAL | Status: DC
  Administered 2020-07-20 – 2020-07-21 (×2): 1 via ORAL
  Filled 2020-07-20 (×2): qty 1

## 2020-07-20 MED ORDER — MAGNESIUM SULFATE BOLUS VIA INFUSION
4.0000 g | Freq: Once | INTRAVENOUS | Status: AC
  Administered 2020-07-20: 4 g via INTRAVENOUS
  Filled 2020-07-20: qty 1000

## 2020-07-20 MED ORDER — MAGNESIUM SULFATE 40 GM/1000ML IV SOLN
2.0000 g/h | INTRAVENOUS | Status: DC
  Administered 2020-07-20: 2 g/h via INTRAVENOUS
  Filled 2020-07-20: qty 1000

## 2020-07-20 MED ORDER — GENTAMICIN SULFATE 40 MG/ML IJ SOLN
5.0000 mg/kg | Freq: Once | INTRAVENOUS | Status: AC
  Administered 2020-07-20: 440 mg via INTRAVENOUS
  Filled 2020-07-20: qty 11

## 2020-07-20 MED ORDER — ZOLPIDEM TARTRATE 5 MG PO TABS
5.0000 mg | ORAL_TABLET | Freq: Every evening | ORAL | Status: DC | PRN
Start: 2020-07-20 — End: 2020-07-22

## 2020-07-20 MED ORDER — LABETALOL HCL 5 MG/ML IV SOLN: 20.0000 mg | INTRAVENOUS | Status: DC | PRN

## 2020-07-20 MED ORDER — LABETALOL HCL 5 MG/ML IV SOLN: 40.0000 mg | INTRAVENOUS | Status: DC | PRN

## 2020-07-20 MED ORDER — DIPHENHYDRAMINE HCL 25 MG PO CAPS: 25.0000 mg | ORAL_CAPSULE | Freq: Four times a day (QID) | ORAL | Status: DC | PRN

## 2020-07-20 MED ORDER — IBUPROFEN 600 MG PO TABS
600.0000 mg | ORAL_TABLET | Freq: Four times a day (QID) | ORAL | Status: DC
  Administered 2020-07-20 – 2020-07-22 (×8): 600 mg via ORAL
  Filled 2020-07-20 (×9): qty 1

## 2020-07-20 MED ORDER — BENZOCAINE-MENTHOL 20-0.5 % EX AERO
1.0000 "application " | INHALATION_SPRAY | CUTANEOUS | Status: DC | PRN
  Administered 2020-07-20: 1 via TOPICAL
  Filled 2020-07-20 (×2): qty 56

## 2020-07-20 MED ORDER — DIBUCAINE (PERIANAL) 1 % EX OINT: 1.0000 "application " | TOPICAL_OINTMENT | CUTANEOUS | Status: DC | PRN

## 2020-07-20 MED ORDER — SODIUM CHLORIDE 0.9 % IV SOLN
2.0000 g | Freq: Four times a day (QID) | INTRAVENOUS | Status: DC
  Administered 2020-07-20 (×4): 2 g via INTRAVENOUS
  Filled 2020-07-20 (×5): qty 2000

## 2020-07-20 MED ORDER — TETANUS-DIPHTH-ACELL PERTUSSIS 5-2.5-18.5 LF-MCG/0.5 IM SUSY: 0.5000 mL | PREFILLED_SYRINGE | Freq: Once | INTRAMUSCULAR | Status: DC

## 2020-07-20 MED ORDER — LACTATED RINGERS IV SOLN: INTRAVENOUS | Status: DC

## 2020-07-20 MED ORDER — HYDRALAZINE HCL 20 MG/ML IJ SOLN
10.0000 mg | INTRAMUSCULAR | Status: DC | PRN
Start: 2020-07-20 — End: 2020-07-22

## 2020-07-20 MED ORDER — MAGNESIUM SULFATE 40 GM/1000ML IV SOLN
2.0000 g/h | INTRAVENOUS | Status: DC
  Filled 2020-07-20: qty 1000

## 2020-07-20 MED ORDER — LABETALOL HCL 5 MG/ML IV SOLN: 80.0000 mg | INTRAVENOUS | Status: DC | PRN

## 2020-07-20 MED ORDER — SIMETHICONE 80 MG PO CHEW: 80.0000 mg | CHEWABLE_TABLET | ORAL | Status: DC | PRN

## 2020-07-20 MED ORDER — ACETAMINOPHEN 500 MG PO TABS
1000.0000 mg | ORAL_TABLET | Freq: Once | ORAL | Status: AC
  Administered 2020-07-20: 1000 mg via ORAL
  Filled 2020-07-20: qty 2

## 2020-07-20 MED ORDER — ONDANSETRON HCL 4 MG/2ML IJ SOLN: 4.0000 mg | INTRAMUSCULAR | Status: DC | PRN

## 2020-07-20 MED ORDER — SODIUM CHLORIDE 0.9 % IV SOLN
500.0000 mg | Freq: Once | INTRAVENOUS | Status: AC
  Administered 2020-07-20: 500 mg via INTRAVENOUS
  Filled 2020-07-20 (×2): qty 25

## 2020-07-20 MED ORDER — ACETAMINOPHEN 325 MG PO TABS: 650.0000 mg | ORAL_TABLET | ORAL | Status: DC | PRN

## 2020-07-20 MED ORDER — COCONUT OIL OIL
1.0000 "application " | TOPICAL_OIL | Status: DC | PRN
  Administered 2020-07-20: 1 via TOPICAL

## 2020-07-20 NOTE — Progress Notes (Signed)
Labor Progress Note Molly Hensley is a 25 y.o. G2P0010 at [redacted]w[redacted]d presented for 07/18/20 for IOL-gHTN. S: Fever of 100.9*F  O:  BP (!) 143/96   Pulse (!) 115   Temp (!) 100.9 F (38.3 C) (Oral)   Resp 18   Ht 5\' 3"  (1.6 m)   Wt 87.7 kg   LMP 10/30/2019 (Exact Date)   SpO2 99%   BMI 34.26 kg/m  EFM: baseline 140 bpm/mod variability/+accels/+late decels that return to baseline Toco: q1-3 min  CVE: Dilation: 9 Effacement (%): 100 Station: Plus 1 Presentation: Vertex Exam by:: [email or web address]   A&P: 25 y.o. G2P0010 [redacted]w[redacted]d presented for IOL-gHTN. #IOL: S/p FB and cyto x1. Pitocin started 1/18 @2350  and continued overnight. AROM with clear fluid 1/19 @1130  and IUPC placed. Patient had prolonged decel so pitocin was stopped and restarted at 1300. Currently @14mL /hr, have held at this level d/t decels and patient has made good change.  #III: patient had fever of 100.9*F at 0100, given 1g of tylenol, will give ampicillin 2g q6h and gentamicin 5mg /kg x1 dose. Placenta will go to pathology. #Pain: epidural #FWB: cat 2  #GBS negative #gHTN: One severe range BP at 1641 of 154/115, then a repeat of 166/95 at 2330. Subsequent BP all MR. PreE labs WNL on admission, and repeat preE labs WNL tonight. Asymptomatic. Will start Mg for PreE. #anemia of pregnancy: last hgb 8.9, IV vs PO iron postpartum pending EBL.  2/19, MD 1:15 AM

## 2020-07-20 NOTE — Discharge Instructions (Signed)
Postpartum Care After Vaginal Delivery The following information offers guidance about how to care for yourself from the time you deliver your baby to 6-12 weeks after delivery (postpartum period). If you have problems or questions, contact your health care provider for more specific instructions. Follow these instructions at home: Vaginal bleeding  It is normal to have vaginal bleeding (lochia) after delivery. Wear a sanitary pad for bleeding and discharge. ? During the first week after delivery, the amount and appearance of lochia is often similar to a menstrual period. ? Over the next few weeks, it will gradually decrease to a dry, yellow-brown discharge. ? For most women, lochia stops completely by 4-6 weeks after delivery, but can vary.  Change your sanitary pads frequently. Watch for any changes in your flow, such as: ? A sudden increase in volume. ? A change in color. ? Large blood clots.  If you pass a blood clot from your vagina, save it and call your health care provider. Do not flush blood clots down the toilet before talking with your health care provider.  Do not use tampons or douches until your health care provider approves.  If you are not breastfeeding, your period should return 6-8 weeks after delivery. If you are feeding your baby breast milk only, your period may not return until you stop breastfeeding. Perineal care  Keep the area between the vagina and the anus (perineum) clean and dry. Use medicated pads and pain-relieving sprays and creams as directed.  If you had a surgical cut in the perineum (episiotomy) or a tear, check the area for signs of infection until you are healed. Check for: ? More redness, swelling, or pain. ? Fluid or blood coming from the cut or tear. ? Warmth. ? Pus or a bad smell.  You may be given a squirt bottle to use instead of wiping to clean the perineum area after you use the bathroom. Pat the area gently to dry it.  To relieve pain  caused by an episiotomy, a tear, or swollen veins in the anus (hemorrhoids), take a warm sitz bath 2-3 times a day. In a sitz bath, the warm water should only come up to your hips and cover your buttocks.   Breast care  In the first few days after delivery, your breasts may feel heavy, full, and uncomfortable (breast engorgement). Milk may also leak from your breasts. Ask your health care provider about ways to help relieve the discomfort.  If you are breastfeeding: ? Wear a bra that supports your breasts and fits well. Use breast pads to absorb milk that leaks. ? Keep your nipples clean and dry. Apply creams and ointments as told. ? You may have uterine contractions every time you breastfeed for up to several weeks after delivery. This helps your uterus return to its normal size. ? If you have any problems with breastfeeding, notify your health care provider or lactation consultant.  If you are not breastfeeding: ? Avoid touching your breasts. Do not squeeze out (express) milk. Doing this can make your breasts produce more milk. ? Wear a good-fitting bra and use cold packs to help with swelling. Intimacy and sexuality  Ask your health care provider when you can engage in sexual activity. This may depend upon: ? Your risk of infection. ? How fast you are healing. ? Your comfort and desire to engage in sexual activity.  You are able to get pregnant after delivery, even if you have not had your period. Talk with   your health care provider about methods of birth control (contraception) or family planning if you desire future pregnancies. Medicines  Take over-the-counter and prescription medicines only as told by your health care provider.  Take an over-the-counter stool softener to help ease bowel movements as told by your health care provider.  If you were prescribed an antibiotic medicine, take it as told by your health care provider. Do not stop taking the antibiotic even if you start to  feel better.  Review all previous and current prescriptions to check for possible transfer into breast milk. Activity  Gradually return to your normal activities as told by your health care provider.  Rest as much as possible. Nap while your baby is sleeping. Eating and drinking  Drink enough fluid to keep your urine pale yellow.  To help prevent or relieve constipation, eat high-fiber foods every day.  Choose healthy eating to support breastfeeding or weight loss goals.  Take your prenatal vitamins until your health care provider tells you to stop.   General tips/recommendations  Do not use any products that contain nicotine or tobacco. These products include cigarettes, chewing tobacco, and vaping devices, such as e-cigarettes. If you need help quitting, ask your health care provider.  Do not drink alcohol, especially if you are breastfeeding.  Do not take medications or drugs that are not prescribed to you, especially if you are breastfeeding.  Visit your health care provider for a postpartum checkup within the first 3-6 weeks after delivery.  Complete a comprehensive postpartum visit no later than 12 weeks after delivery.  Keep all follow-up visits for you and your baby. Contact a health care provider if:  You feel unusually sad or worried.  Your breasts become red, painful, or hard.  You have a fever or other signs of an infection.  You have bleeding that is soaking through one pad an hour or you have blood clots.  You have a severe headache that doesn't go away or you have vision changes.  You have nausea and vomiting and are unable to eat or drink anything for 24 hours. Get help right away if:  You have chest pain or difficulty breathing.  You have sudden, severe leg pain.  You faint or have a seizure.  You have thoughts about hurting yourself or your baby. If you ever feel like you may hurt yourself or others, or have thoughts about taking your own life,  get help right away. Go to your nearest emergency department or:  Call your local emergency services (911 in the [place]  The National Suicide Prevention Lifeline at 312-142-2401. This suicide crisis helpline is open 24 hours a day.  Text the Crisis Text Line at 515-633-5667 (in the [place] Summary  The period of time after you deliver your newborn up to 6-12 weeks after delivery is called the postpartum period.  Keep all follow-up visits for you and your baby.  Review all previous and current prescriptions to check for possible transfer into breast milk.  Contact a health care provider if you feel unusually sad or worried during the postpartum period. This information is not intended to replace advice given to you by your health care provider. Make sure you discuss any questions you have with your health care provider. Document Revised: 03/02/2020 Document Reviewed: 03/02/2020 Elsevier Patient Education  2021 Elsevier Inc. Levonorgestrel intrauterine device (IUD) What is this medicine? LEVONORGESTREL IUD (LEE voe nor jes trel) is a contraceptive (birth control) device. The device is placed inside the  uterus by a health care provider. It is used to prevent pregnancy. Some devices can also be used to treat heavy bleeding that occurs during your period. This medicine may be used for other purposes; ask your health care provider or pharmacist if you have questions. COMMON BRAND NAME(S): Cameron Ali What should I tell my health care provider before I take this medicine? They need to know if you have any of these conditions:  abnormal Pap smear  cancer of the breast, uterus, or cervix  diabetes  endometritis  genital or pelvic infection now or in the past  have more than one sexual partner or your partner has more than one partner  heart disease  history of an ectopic or tubal pregnancy  immune system problems  IUD in place  liver disease or tumor  problems  with blood clots or take blood-thinners  seizures  use intravenous drugs  uterus of unusual shape  vaginal bleeding that has not been explained  an unusual or allergic reaction to levonorgestrel, other hormones, silicone, or polyethylene, medicines, foods, dyes, or preservatives  pregnant or trying to get pregnant  breast-feeding How should I use this medicine? This device is placed inside the uterus by a health care professional. Talk to your pediatrician regarding the use of this medicine in children. Special care may be needed. Overdosage: If you think you have taken too much of this medicine contact a poison control center or emergency room at once. NOTE: This medicine is only for you. Do not share this medicine with others. What if I miss a dose? This does not apply. Depending on the brand of device you have inserted, the device will need to be replaced every 3 to 7 years if you wish to continue using this type of birth control. What may interact with this medicine? Do not take this medicine with any of the following medications:  amprenavir  bosentan  fosamprenavir This medicine may also interact with the following medications:  aprepitant  armodafinil  barbiturate medicines for inducing sleep or treating seizures  bexarotene  boceprevir  griseofulvin  medicines to treat seizures like carbamazepine, ethotoin, felbamate, oxcarbazepine, phenytoin, topiramate  modafinil  pioglitazone  rifabutin  rifampin  rifapentine  some medicines to treat HIV infection like atazanavir, efavirenz, indinavir, lopinavir, nelfinavir, tipranavir, ritonavir  St. John's wort  warfarin This list may not describe all possible interactions. Give your health care provider a list of all the medicines, herbs, non-prescription drugs, or dietary supplements you use. Also tell them if you smoke, drink alcohol, or use illegal drugs. Some items may interact with your medicine. What  should I watch for while using this medicine? Visit your doctor or health care professional for regular check ups. See your doctor if you or your partner has sexual contact with others, becomes HIV positive, or gets a sexual transmitted disease. This product does not protect you against HIV infection (AIDS) or other sexually transmitted diseases. You can check the placement of the IUD yourself by reaching up to the top of your vagina with clean fingers to feel the threads. Do not pull on the threads. It is a good habit to check placement after each menstrual period. Call your doctor right away if you feel more of the IUD than just the threads or if you cannot feel the threads at all. The IUD may come out by itself. You may become pregnant if the device comes out. If you notice that the IUD has come  out use a backup birth control method like condoms and call your health care provider. Using tampons will not change the position of the IUD and are okay to use during your period. This IUD can be safely scanned with magnetic resonance imaging (MRI) only under specific conditions. Before you have an MRI, tell your healthcare provider that you have an IUD in place, and which type of IUD you have in place. What side effects may I notice from receiving this medicine? Side effects that you should report to your doctor or health care professional as soon as possible:  allergic reactions like skin rash, itching or hives, swelling of the face, lips, or tongue  fever, flu-like symptoms  genital sores  high blood pressure  no menstrual period for 6 weeks during use  pain, swelling, warmth in the leg  pelvic pain or tenderness  severe or sudden headache  signs of pregnancy  stomach cramping  sudden shortness of breath  trouble with balance, talking, or walking  unusual vaginal bleeding, discharge  yellowing of the eyes or skin Side effects that usually do not require medical attention (report to  your doctor or health care professional if they continue or are bothersome):  acne  breast pain  change in sex drive or performance  changes in weight  cramping, dizziness, or faintness while the device is being inserted  headache  irregular menstrual bleeding within first 3 to 6 months of use  nausea This list may not describe all possible side effects. Call your doctor for medical advice about side effects. You may report side effects to FDA at 1-800-FDA-1088. Where should I keep my medicine? This does not apply. NOTE: This sheet is a summary. It may not cover all possible information. If you have questions about this medicine, talk to your doctor, pharmacist, or health care provider.  2021 Elsevier/Gold Standard (2020-02-15 16:27:45)

## 2020-07-20 NOTE — Anesthesia Postprocedure Evaluation (Signed)
Anesthesia Post Note  Patient: Molly Hensley  Procedure(s) Performed: AN AD HOC LABOR EPIDURAL     Patient location during evaluation: OB High Risk Anesthesia Type: Epidural Level of consciousness: awake, awake and alert and oriented Pain management: pain level controlled Vital Signs Assessment: post-procedure vital signs reviewed and stable Respiratory status: spontaneous breathing and respiratory function stable Cardiovascular status: blood pressure returned to baseline Postop Assessment: no headache, epidural receding, patient able to bend at knees, adequate PO intake, no backache, no apparent nausea or vomiting and able to ambulate Anesthetic complications: no   No complications documented.  Last Vitals:  Vitals:   07/20/20 0952 07/20/20 1118  BP: 125/76 (!) 139/98  Pulse: 98 90  Resp:  18  Temp:  36.8 C  SpO2: 98% 97%    Last Pain:  Vitals:   07/20/20 1118  TempSrc: Oral  PainSc:    Pain Goal:                   Cleda Clarks

## 2020-07-20 NOTE — Lactation Note (Signed)
This note was copied from a baby's chart. Lactation Consultation Note  Patient Name: Molly Hensley ALPFX'T Date: 07/20/2020 Reason for consult: Initial assessment Age:25 hours  P1 mother to 7 hour old baby Molly, Molly Hensley, was seen for initial lactation assessment. Molly Hensley informs that she has not tried to latch infant since initial skin to skin. She has feed her 68ml of formula at 6:20am as she expressed she wanted to wait to latch baby with lactation services.   Infant was resting in bassinet asleep with pacifier upon entry. LC provided education on risk of early pacifier usage around feeding cues and successful breastfeeding. Pacifier was removed and infant aroused to initiate breastfeeding. [facility] student gave hand expression demonstration on L breast. Molly Hensley was able to repeat on R breast. Colostrum was easily expressed on both breast. LC student assisted with latching infant to L breast in football hold at 10:08am. Infant easily grasped breast with flanged lips. Rhythmic sucking and few swallows noted. Mother expressed no discomfort or pain with latch.   Molly Hensley is a participant of [employer]. She informs that she may have received a pump during baby shower. She will look into as she has plans to return to work in future. However, she does express wanting to offer breast milk and formula.   Reviewed education on what to expect over first 72 hours of life, how to know infant is getting enough via input/output, hand expression, feeding cues, nursing frequency/length and inpatient lactation services.   Infant was left at breast upon exit.   Plan: - Follow Molly Hensley's feeding cues, feeding on demand, or at least 8x in 24 hour period. - Encouraged to arouse Molly Hensley if she has not feed within 3 hour period.  - Recommended to discontinue pacifier usage - LC will follow up inpatient tomorrow or as requested.   Maternal Data Formula Feeding for Exclusion: No Has patient been taught Hand Expression?:  Yes Does the patient have breastfeeding experience prior to this delivery?: No  Feeding Feeding Type: Breast Fed  LATCH Score Latch: Grasps breast easily, tongue down, lips flanged, rhythmical sucking.  Audible Swallowing: A few with stimulation  Type of Nipple: Everted at rest and after stimulation  Comfort (Breast/Nipple): Soft / non-tender  Hold (Positioning): Assistance needed to correctly position infant at breast and maintain latch.  LATCH Score: 8  Interventions Interventions: Breast feeding basics reviewed;Assisted with latch;Skin to skin;Breast massage;Hand express;Support pillows  Lactation Tools Discussed/Used     Consult Status Consult Status: Follow-up Date: 07/21/20 Follow-up type: In-patient    Zola Button 07/20/2020, 10:47 AM

## 2020-07-20 NOTE — Discharge Summary (Signed)
Postpartum Discharge Summary     Patient Name: Molly Hensley DOB: 06-Aug-1995 MRN: [id number]  Date of admission: 07/18/2020 Delivery date:07/20/2020  Delivering provider: Arrie Senate  Date of discharge: 07/22/2020  Admitting diagnosis: Supervision of high risk pregnancy, antepartum [O09.90] Intrauterine pregnancy: [redacted]w[redacted]d    Secondary diagnosis:  Active Problems:   Tobacco use disorder   Supervision of high risk pregnancy, antepartum   Vaginal delivery   IUD (intrauterine device) in place   Chorioamnionitis   Acute on chronic anemia   Pre-eclampsia  Additional problems: none    Discharge diagnosis: Term Pregnancy Delivered, Preeclampsia (severe) and Anemia                                              Post partum procedures:post placental liletta placed, venofer postpartum Augmentation: AROM, Pitocin, Cytotec and IP Foley Complications: Intrauterine Inflammation or infection (Chorioamniotis)  Hospital course: Induction of Labor With Vaginal Delivery   25y.o. yo G2P0010 at 34w5dasas admitted to the hospital 07/18/2020 for induction of labor.  Indication for induction: Preeclampsia. Patient was initially diagnosed as gHTN and admitted for IOL. During her admission to L&D she had several severe range BP and was started on Mg. Patient was augmented with cytotec, FB, pitocin, and AROM as noted above. Prior to delivery she became febrile and amp/gent were administered. She progressed to complete and had an uncomplicated delivery. Membrane Rupture Time/Date: 11:35 AM ,07/19/2020   Delivery Method:Vaginal, Spontaneous  Episiotomy: None  Lacerations:  Sulcus  Details of delivery can be found in separate delivery note.  Patient had a routine postpartum course. Started on anti-hypertensives to good effect. Patient is discharged home 07/22/20.  Newborn Data: Birth date:07/20/2020  Birth time:3:24 AM  Gender:Female  Living status:Living  Apgars:8 ,9  Weight:3164 g   Magnesium  Sulfate received: Yes: Seizure prophylaxis BMZ received: No Rhophylac:N/A MMR:No T-DaP:Given postpartum Flu: No Transfusion:No  Physical exam  Vitals:   07/21/20 2325 07/22/20 0444 07/22/20 0532 07/22/20 0749  BP: 118/81 (!) 141/95 (!) 147/92 132/88  Pulse: 94 95 82 98  Resp: _0 Temp: 98.6 F (37 C) 97.9 F (36.6 C) 98.5 F (36.9 C) 98.7 F (37.1 C)  TempSrc: Oral Oral Oral Oral  SpO2: 98% 100%  99%  Weight:      Height:       General: alert, cooperative and no distress Lochia: appropriate Uterine Fundus: firm Incision: N/A DVT Evaluation: No evidence of DVT seen on physical exam. Labs: Lab Results  Component Value Date   WBC 15.4 (H) 07/19/2020   HGB 9.1 (L) 07/19/2020   HCT 26.6 (L) 07/19/2020   MCV 91.7 07/19/2020   PLT 151 07/19/2020   CMP Latest Ref Rng & Units 07/19/2020  Glucose 70 - 99 mg/dL 86  BUN 6 - 20 mg/dL <5(L)  Creatinine 0.44 - 1.00 mg/dL 0.69  Sodium 135 - 145 mmol/L 133(L)  Potassium 3.5 - 5.1 mmol/L 2.9(L)  Chloride 98 - 111 mmol/L 106  CO2 22 - 32 mmol/L 20(L)  Calcium 8.9 - 10.3 mg/dL 8.6(L)  Total Protein 6.5 - 8.1 g/dL 5.8(L)  Total Bilirubin 0.3 - 1.2 mg/dL 1.1  Alkaline Phos 38 - 126 U/L 101  AST 15 - 41 U/L 20  ALT 0 - 44 U/L 15   Edinburgh Score: Edinburgh Postnatal Depression Scale Screening  Tool 07/21/2020  I have been able to laugh and see the funny side of things. 0  I have looked forward with enjoyment to things. 0  I have blamed myself unnecessarily when things went wrong. 0  I have been anxious or worried for no good reason. 0  I have felt scared or panicky for no good reason. 2  Things have been getting on top of me. 0  I have been so unhappy that I have had difficulty sleeping. 0  I have felt sad or miserable. 0  I have been so unhappy that I have been crying. 0  The thought of harming myself has occurred to me. 0  Edinburgh Postnatal Depression Scale Total 2     After visit meds:  Allergies as of  07/22/2020   No Known Allergies     Medication List    STOP taking these medications   Doxylamine-Pyridoxine 10-10 MG Tbec   ondansetron 4 MG disintegrating tablet Commonly known as: Zofran ODT     TAKE these medications   ferrous sulfate 325 (65 FE) MG tablet Commonly known as: FerrouSul Take 1 tablet (325 mg total) by mouth every other day.   ibuprofen 600 MG tablet Commonly known as: ADVIL Take 1 tablet (600 mg total) by mouth every 6 (six) hours.   nicotine polacrilex 4 MG lozenge Commonly known as: COMMIT Take 4 mg by mouth as needed for smoking cessation.   NIFEdipine 30 MG 24 hr tablet Commonly known as: ADALAT CC Take 1 tablet (30 mg total) by mouth daily.   PRENATAL VITAMINS PO Take by mouth.   senna-docusate 8.6-50 MG tablet Commonly known as: Senokot-S Take 2 tablets by mouth at bedtime as needed for mild constipation.      Discharge home in stable condition Infant Feeding: Bottle and Breast Infant Disposition:home with mother Discharge instruction: per After Visit Summary and Postpartum booklet. Activity: Advance as tolerated. Pelvic rest for 6 weeks.  Diet: routine diet Future Appointments:No future appointments. Follow up Visit:  Follow-up Information    Department, [facility]. Schedule an appointment as soon as possible for a visit in 1 week(s).   Why: for BP check Contact information: [place] [address] [phone]              Message sent to [facility] to schedule 1 week BP check for patient, patient will then follow up with [facility] for postpartum appt.   Please schedule this patient for a In person postpartum visit in 4 weeks with the following provider: Any provider. Additional Postpartum F/U:BP check 1 week  High risk pregnancy complicated by: HTN Delivery mode:  Vaginal, Spontaneous  Anticipated Birth Control:  PP IUD placed, need string check at postpartum visit.   07/22/2020 Sloan Leiter, MD

## 2020-07-21 ENCOUNTER — Other Ambulatory Visit (HOSPITAL_COMMUNITY): Payer: Self-pay | Admitting: Obstetrics & Gynecology

## 2020-07-21 DIAGNOSIS — D649 Anemia, unspecified: Secondary | ICD-10-CM | POA: Diagnosis present

## 2020-07-21 MED ORDER — FERROUS SULFATE 325 (65 FE) MG PO TABS: 325.0000 mg | ORAL_TABLET | ORAL | 0 refills | Status: DC

## 2020-07-21 MED ORDER — NIFEDIPINE ER 30 MG PO TB24: 30.0000 mg | ORAL_TABLET | Freq: Every day | ORAL | 0 refills | Status: DC

## 2020-07-21 MED ORDER — IBUPROFEN 600 MG PO TABS: 600.0000 mg | ORAL_TABLET | Freq: Four times a day (QID) | ORAL | 0 refills | Status: DC

## 2020-07-21 MED ORDER — SENNOSIDES-DOCUSATE SODIUM 8.6-50 MG PO TABS: 2.0000 | ORAL_TABLET | Freq: Every evening | ORAL | 0 refills | Status: DC | PRN

## 2020-07-21 MED ORDER — NIFEDIPINE ER OSMOTIC RELEASE 30 MG PO TB24
30.0000 mg | ORAL_TABLET | Freq: Every day | ORAL | Status: DC
  Administered 2020-07-21: 30 mg via ORAL
  Filled 2020-07-21: qty 1

## 2020-07-21 MED FILL — IBUPROFEN 600 MG TABLET: 600 | 8 days supply | Qty: 30 | Fill #0

## 2020-07-21 MED FILL — SENEXON-S 8.6-50 MG TABS: 8.6-50 | 15 days supply | Qty: 30 | Fill #0

## 2020-07-21 MED FILL — NIFEdipine ER 30 MG TB24: 30 | 30 days supply | Qty: 30 | Fill #0

## 2020-07-21 MED FILL — FERROUS SULFATE 325 MG TAB: 325 (65 FE) | 60 days supply | Qty: 30 | Fill #0

## 2020-07-21 NOTE — Progress Notes (Signed)
CSW received consult for hx of marijuana use.  Referral was screened out due to the following: °~MOB had no documented substance use after initial prenatal visit/+UPT. °~MOB had no positive drug screens after initial prenatal visit/+UPT. °~Baby's UDS is negative. ° °Please consult CSW if current concerns arise or by MOB's request. ° °CSW will monitor CDS results and make report to Child Protective Services if warranted. ° °Nery Frappier Boyd-Gilyard, MSW, LCSW °Clinical Social Work °[phone] ° °

## 2020-07-21 NOTE — Progress Notes (Signed)
RN pulled fentanyl from pyxis per anesthesia, Dr. Tacy Dura, for epidural redose. Epidural redose administered by SRNA.

## 2020-07-21 NOTE — Progress Notes (Signed)
Post Partum Day 1 s/p VD after IOL for severe preeclampsia. Labor course complicated by Triple I, anemia  Subjective: Up ad lib, voiding, tolerating PO and + flatus. Mild abdominal pain, improved from yesterday.  Patient denies any headaches, visual symptoms, RUQ/epigastric pain or other concerning symptoms. Baby stable at bedside.  Objective: Blood pressure 135/87, pulse 87, temperature 98.7 F (37.1 C), temperature source Oral, resp. rate 18, height 5\' 3"  (1.6 m), weight 87.7 kg, last menstrual period 10/30/2019, SpO2 100 %, unknown if currently breastfeeding. Patient Vitals for the past 24 hrs:  BP Temp Temp src Pulse Resp SpO2  07/21/20 0339 135/87 98.7 F (37.1 C) Oral 87 18 100 %  07/20/20 2344 -- -- -- -- -- 96 %  07/20/20 2343 109/86 98.5 F (36.9 C) Oral 89 16 100 %  07/20/20 1934 135/73 98.8 F (37.1 C) Oral 88 16 100 %  07/20/20 1515 138/88 97.7 F (36.5 C) Oral 82 18 100 %  07/20/20 1445 -- -- -- -- 18 --  07/20/20 1340 -- -- -- -- 18 --  07/20/20 1230 -- -- -- -- 17 --  07/20/20 1118 (!) 139/98 98.3 F (36.8 C) Oral 90 18 97 %  07/20/20 1115 -- -- -- -- 17 --  07/20/20 0952 125/76 -- -- 98 -- 98 %  07/20/20 0858 (!) 134/92 97.7 F (36.5 C) Oral 97 18 100 %  07/20/20 0800 (!) 135/98 -- -- 94 -- --  07/20/20 0755 (!) 143/98 98 F (36.7 C) Oral 94 18 100 %     Intake/Output Summary (Last 24 hours) at 07/21/2020 0718 Last data filed at 07/21/2020 0320 Gross per 24 hour  Intake 2506.86 ml  Output 8600 ml  Net -6093.14 ml    Physical Exam:  General: no distress Lochia: appropriate Uterine Fundus: firm, mild TTP DVT Evaluation: No evidence of DVT seen on physical exam. Negative Homan's sign. No cords or calf tenderness.  Labs: CBC Latest Ref Rng & Units 07/19/2020 07/18/2020 07/18/2020  WBC 4.0 - 10.5 K/uL 15.4(H) 11.1(H) 9.4  Hemoglobin 12.0 - 15.0 g/dL 07/20/2020) 8.9(L) 9.3(L)  Hematocrit 36.0 - 46.0 % 26.6(L) 27.1(L) 27.4(L)  Platelets 150 - 400 K/uL 151 162 181    CMP Latest Ref Rng & Units 07/19/2020 07/18/2020 06/12/2014  Glucose 70 - 99 mg/dL 86 85 98  BUN 6 - 20 mg/dL 06/14/2014) 7 11  Creatinine 0.44 - 1.00 mg/dL <1(K 4.81 8.56  Sodium 135 - 145 mmol/L 133(L) 133(L) 139  Potassium 3.5 - 5.1 mmol/L 2.9(L) 3.2(L) 3.8  Chloride 98 - 111 mmol/L 106 106 105  CO2 22 - 32 mmol/L 20(L) 18(L) 21  Calcium 8.9 - 10.3 mg/dL 3.14) 9.0 9.5  Total Protein 6.5 - 8.1 g/dL 9.7(W) 6.6 7.8  Total Bilirubin 0.3 - 1.2 mg/dL 1.1 0.5 0.4  Alkaline Phos 38 - 126 U/L 101 103 56  AST 15 - 41 U/L 20 22 17   ALT 0 - 44 U/L 15 18 12    Assessment/Plan: Procardia XL 30 mg daily started Already received Venofer for anemia (acute on chronic) Breast and bottle feeding Contraception Liletta (already in place) Plan for discharge tomorrow if remains stable, Meds-to-Beds ordered to get Rx today.   LOS: 3 days   2.6(V, MD 07/21/2020, 7:18 AM

## 2020-07-21 NOTE — Lactation Note (Signed)
This note was copied from a baby's chart. Lactation Consultation Note  Patient Name: Molly Hensley [id number] Date: 07/21/2020 Reason for consult: Follow-up assessment Age:25 hours Baby Molly [name] breastfeeding on arrival in football hold.  She is swaddled and asked mom how it felt and she reported like a little baby pinch.   Asked mom if LC could show her a different position.  Mom agreed.  Unswaddled [name] and put her tummy to tummy with mom in cradle hold.  Infant woke up more and was having rhythmic sucking and intermittent swallows.  Mom able to hand express easily.  Urged hand express and pat expressed mothers milk on nipples and air dry and then use coconut oil.    Urged mom to pump everytime bottle given even if not getting anything Urged her to feed [name] any drops that she gets with pumping. Praised breastfeeding. Mom has breastfeeding d/c [facility] resource Booklet for home use. Maternal Data Formula Feeding for Exclusion: No  Feeding Feeding Type: Breast Fed  LATCH Score Latch: Grasps breast easily, tongue down, lips flanged, rhythmical sucking. (bf swaddled on arrival/sleepy/unswaddled her)  Audible Swallowing: A few with stimulation (no swallows at first /unswaddled her feeding picked up)  Type of Nipple: Everted at rest and after stimulation  Comfort (Breast/Nipple): Soft / non-tender  Hold (Positioning): Assistance needed to correctly position infant at breast and maintain latch. (assisted with unswaddling and getting her in closer)  [facility] Score: 8  Interventions Interventions: Breast feeding basics reviewed;Assisted with latch;Hand express;Adjust position;Support pillows;Position options;Expressed milk;Coconut oil;Hand pump  Lactation Tools Discussed/Used     Consult Status Consult Status: Follow-up Date: 07/22/20 Follow-up type: In-patient    [facility] Michaelle Copas 07/21/2020, 10:07 AM

## 2020-07-22 DIAGNOSIS — O1490 Unspecified pre-eclampsia, unspecified trimester: Secondary | ICD-10-CM | POA: Diagnosis present

## 2020-07-22 NOTE — Lactation Note (Signed)
This note was copied from a baby's chart. Lactation Consultation Note  Patient Name: Molly Hensley [id number] Date: 07/22/2020 Reason for consult: Follow-up assessment Age:25 hours LC to room for f/u visit. Mom and baby to d/c today. Mom is offering breast and bottle today per her choice. She has + breast changes today. LC reviewed signs and symptoms of engorgement and encouraged frequent milk removal to avoid. Discouraged pacifier use in 1st couple of weeks and encouraged bf 8+ times per day with feeding cues. Patient was provided with the opportunity to ask questions. All concerns were addressed.  Mom is aware of community resources.   Consult Status Consult Status: Complete Follow-up type: In-patient   Elder Negus, MA IBCLC 07/22/2020, 10:55 AM

## 2020-07-24 LAB — SURGICAL PATHOLOGY

## 2020-07-26 ENCOUNTER — Inpatient Hospital Stay (HOSPITAL_COMMUNITY)
Admission: AD | Admit: 2020-07-26 | Discharge: 2020-07-26 | Disposition: A | Discharge status: Home / Self Care | Payer: Medicaid Other | Attending: Obstetrics & Gynecology | Admitting: Obstetrics & Gynecology

## 2020-07-26 ENCOUNTER — Other Ambulatory Visit: Payer: Self-pay

## 2020-07-26 ENCOUNTER — Encounter (HOSPITAL_COMMUNITY): Payer: Self-pay | Admitting: Obstetrics & Gynecology

## 2020-07-26 DIAGNOSIS — R519 Headache, unspecified: Secondary | ICD-10-CM | POA: Diagnosis not present

## 2020-07-26 DIAGNOSIS — O135 Gestational [pregnancy-induced] hypertension without significant proteinuria, complicating the puerperium: Secondary | ICD-10-CM

## 2020-07-26 DIAGNOSIS — O99335 Smoking (tobacco) complicating the puerperium: Secondary | ICD-10-CM | POA: Insufficient documentation

## 2020-07-26 DIAGNOSIS — F17210 Nicotine dependence, cigarettes, uncomplicated: Secondary | ICD-10-CM | POA: Insufficient documentation

## 2020-07-26 DIAGNOSIS — O99893 Other specified diseases and conditions complicating puerperium: Secondary | ICD-10-CM | POA: Diagnosis not present

## 2020-07-26 DIAGNOSIS — O9089 Other complications of the puerperium, not elsewhere classified: Secondary | ICD-10-CM | POA: Diagnosis not present

## 2020-07-26 DIAGNOSIS — Z975 Presence of (intrauterine) contraceptive device: Secondary | ICD-10-CM

## 2020-07-26 DIAGNOSIS — O139 Gestational [pregnancy-induced] hypertension without significant proteinuria, unspecified trimester: Secondary | ICD-10-CM

## 2020-07-26 LAB — CBC WITH DIFFERENTIAL/PLATELET
Abs Immature Granulocytes: 0.21 10*3/uL — ABNORMAL HIGH (ref 0.00–0.07)
Basophils Absolute: 0 10*3/uL (ref 0.0–0.1)
Basophils Relative: 0 %
Eosinophils Absolute: 0.2 10*3/uL (ref 0.0–0.5)
Eosinophils Relative: 2 %
HCT: 28.4 % — ABNORMAL LOW (ref 36.0–46.0)
Hemoglobin: 9.1 g/dL — ABNORMAL LOW (ref 12.0–15.0)
Immature Granulocytes: 2 %
Lymphocytes Relative: 30 %
Lymphs Abs: 3.1 10*3/uL (ref 0.7–4.0)
MCH: 29.9 pg (ref 26.0–34.0)
MCHC: 32 g/dL (ref 30.0–36.0)
MCV: 93.4 fL (ref 80.0–100.0)
Monocytes Absolute: 0.8 10*3/uL (ref 0.1–1.0)
Monocytes Relative: 7 %
Neutro Abs: 5.9 10*3/uL (ref 1.7–7.7)
Neutrophils Relative %: 59 %
Platelets: 277 10*3/uL (ref 150–400)
RBC: 3.04 MIL/uL — ABNORMAL LOW (ref 3.87–5.11)
RDW: 13.3 % (ref 11.5–15.5)
WBC: 10.1 10*3/uL (ref 4.0–10.5)
nRBC: 0 % (ref 0.0–0.2)

## 2020-07-26 LAB — COMPREHENSIVE METABOLIC PANEL
ALT: 22 U/L (ref 0–44)
AST: 18 U/L (ref 15–41)
Albumin: 2.8 g/dL — ABNORMAL LOW (ref 3.5–5.0)
Alkaline Phosphatase: 74 U/L (ref 38–126)
Anion gap: 8 (ref 5–15)
BUN: 13 mg/dL (ref 6–20)
CO2: 24 mmol/L (ref 22–32)
Calcium: 8.4 mg/dL — ABNORMAL LOW (ref 8.9–10.3)
Chloride: 108 mmol/L (ref 98–111)
Creatinine, Ser: 0.79 mg/dL (ref 0.44–1.00)
GFR, Estimated: >60 mL/min (ref >60)
Glucose, Bld: 100 mg/dL — ABNORMAL HIGH (ref 70–99)
Potassium: 3.2 mmol/L — ABNORMAL LOW (ref 3.5–5.1)
Sodium: 140 mmol/L (ref 135–145)
Total Bilirubin: 0.7 mg/dL (ref 0.3–1.2)
Total Protein: 6.6 g/dL (ref 6.5–8.1)

## 2020-07-26 MED ORDER — NIFEDIPINE ER 30 MG PO TB24: 60.0000 mg | ORAL_TABLET | Freq: Every day | ORAL | 0 refills | Status: DC

## 2020-07-26 MED ORDER — BUTALBITAL-APAP-CAFFEINE 50-325-40 MG PO TABS: 1.0000 | ORAL_TABLET | Freq: Four times a day (QID) | ORAL | 0 refills | Status: DC | PRN

## 2020-07-26 MED ORDER — KETOROLAC TROMETHAMINE 60 MG/2ML IM SOLN
60.0000 mg | Freq: Once | INTRAMUSCULAR | Status: AC
  Administered 2020-07-26: 60 mg via INTRAMUSCULAR
  Filled 2020-07-26 (×2): qty 2

## 2020-07-26 MED ORDER — ACETAMINOPHEN 500 MG PO TABS
1000.0000 mg | ORAL_TABLET | Freq: Once | ORAL | Status: AC
  Administered 2020-07-26: 1000 mg via ORAL
  Filled 2020-07-26: qty 2

## 2020-07-26 NOTE — MAU Provider Note (Signed)
History     CSN: [id number]  Arrival date and time: 07/26/20 1804   Event Date/Time   First Provider Initiated Contact with Patient 07/26/20 1854      Chief Complaint  Patient presents with  . Headache   HPI  Molly Hensley is 25 y.o. female G2P1011 status post vaginal delivery on 07/20/20. On 1/18 she presented to the hospital for IOL secondary to gHTN. She had headache at the time of admission. She was started on procardia 30 and has been taking it as prescribed. Currently she reports HA; the HA is located all over her head, worse in her left temple. She has tried ibuprofen 600 mg which is not helping. She is taking it every 6 hours.  She had a post placental IUD placed and saw her strings hanging out of her vagina. She is unsure if the IUD fell out or if it is still in place. She reports her hands and feet are tingling.  No scotoma.   OB History    Gravida  2   Para  1   Term  1   Preterm      AB  1   Living  1     SAB      IAB  1   Ectopic      Multiple  0   Live Births  1           Past Medical History:  Diagnosis Date  . Asthma    Childhood  . Chlamydia   . Headache     Past Surgical History:  Procedure Laterality Date  . INDUCED ABORTION    . TOOTH EXTRACTION      Family History  Problem Relation Age of Onset  . Hypertension Mother   . Epilepsy Mother   . Heart disease Mother   . Cancer Maternal Grandmother     Social History   Tobacco Use  . Smoking status: Current Every Day Smoker    Packs/day: 0.25    Types: Cigarettes  . Smokeless tobacco: Never Used  . Tobacco comment: cutting down  Vaping Use  . Vaping Use: Never used  Substance Use Topics  . Alcohol use: No    Alcohol/week: 0.0 standard drinks  . Drug use: Not Currently    Types: Marijuana    Comment: not since first trimester    Allergies: No Known Allergies  Medications Prior to Admission  Medication Sig Dispense Refill Last Dose  . ferrous sulfate  (FERROUSUL) 325 (65 FE) MG tablet Take 1 tablet (325 mg total) by mouth every other day. 30 tablet 0 07/26/2020 at Unknown time  . ibuprofen (ADVIL) 600 MG tablet Take 1 tablet (600 mg total) by mouth every 6 (six) hours. 30 tablet 0 07/26/2020 at Unknown time  . nicotine polacrilex (COMMIT) 4 MG lozenge Take 4 mg by mouth as needed for smoking cessation.   Past Month at Unknown time  . NIFEdipine (ADALAT CC) 30 MG 24 hr tablet Take 1 tablet (30 mg total) by mouth daily. 30 tablet 0 07/26/2020 at Unknown time  . Prenatal Vit-Fe Fumarate-FA (PRENATAL VITAMINS PO) Take by mouth.   07/26/2020 at Unknown time  . senna-docusate (SENOKOT-S) 8.6-50 MG tablet Take 2 tablets by mouth at bedtime as needed for mild constipation. 30 tablet 0 07/25/2020 at Unknown time   Results for orders placed or performed during the hospital encounter of 07/26/20 (from the past 48 hour(s))  CBC with Differential/Platelet  Status: Abnormal   Collection Time: 07/26/20  7:03 PM  Result Value Ref Range   WBC 10.1 4.0 - 10.5 K/uL   RBC 3.04 (L) 3.87 - 5.11 MIL/uL   Hemoglobin 9.1 (L) 12.0 - 15.0 g/dL   HCT 21.3 (L) 08.6 - 57.8 %   MCV 93.4 80.0 - 100.0 fL   MCH 29.9 26.0 - 34.0 pg   MCHC 32.0 30.0 - 36.0 g/dL   RDW 46.9 62.9 - 52.8 %   Platelets 277 150 - 400 K/uL   nRBC 0.0 0.0 - 0.2 %   Neutrophils Relative % 59 %   Neutro Abs 5.9 1.7 - 7.7 K/uL   Lymphocytes Relative 30 %   Lymphs Abs 3.1 0.7 - 4.0 K/uL   Monocytes Relative 7 %   Monocytes Absolute 0.8 0.1 - 1.0 K/uL   Eosinophils Relative 2 %   Eosinophils Absolute 0.2 0.0 - 0.5 K/uL   Basophils Relative 0 %   Basophils Absolute 0.0 0.0 - 0.1 K/uL   Immature Granulocytes 2 %   Abs Immature Granulocytes 0.21 (H) 0.00 - 0.07 K/uL    Comment: Performed at [facility], [address], [place], [place] [address]  Comprehensive metabolic panel     Status: Abnormal   Collection Time: 07/26/20  7:03 PM  Result Value Ref Range   Sodium 140 135 - 145 mmol/L    Potassium 3.2 (L) 3.5 - 5.1 mmol/L   Chloride 108 98 - 111 mmol/L   CO2 24 22 - 32 mmol/L   Glucose, Bld 100 (H) 70 - 99 mg/dL    Comment: Glucose reference range applies only to samples taken after fasting for at least 8 hours.   BUN 13 6 - 20 mg/dL   Creatinine, Ser 4.01 0.44 - 1.00 mg/dL   Calcium 8.4 (L) 8.9 - 10.3 mg/dL   Total Protein 6.6 6.5 - 8.1 g/dL   Albumin 2.8 (L) 3.5 - 5.0 g/dL   AST 18 15 - 41 U/L   ALT 22 0 - 44 U/L   Alkaline Phosphatase 74 38 - 126 U/L   Total Bilirubin 0.7 0.3 - 1.2 mg/dL   GFR, Estimated >02 >72 mL/min    Comment: (NOTE) Calculated using the CKD-EPI Creatinine Equation (2021)    Anion gap 8 5 - 15    Comment: Performed at [facility], [address] [place], [place] [address]    Review of Systems  Eyes: Positive for photophobia. Negative for visual disturbance.  Neurological: Positive for headaches.   Physical Exam   Blood pressure 121/87, pulse (!) 114, temperature 99.6 F (37.6 C), temperature source Oral, resp. rate 12, last menstrual period 10/30/2019, SpO2 100 %, unknown if currently breastfeeding.  Patient Vitals for the past 24 hrs:  BP Temp Temp src Pulse Resp SpO2  07/26/20 2105 135/86 - - 89 - -  07/26/20 2046 134/85 - - 88 - -  07/26/20 2031 (!) 136/91 - - (!) 104 - -  07/26/20 2030 (!) 136/91 - - (!) 104 - 100 %  07/26/20 2018 (!) 147/91 - - (!) 112 - -  07/26/20 2001 (!) 137/96 - - 98 - -  07/26/20 1946 133/90 - - (!) 101 - -  07/26/20 1931 (!) 141/97 - - (!) 108 - -  07/26/20 1916 (!) 132/98 - - 97 - -  07/26/20 1915 (!) 132/98 - - 97 - 100 %  07/26/20 1901 125/86 - - (!) 108 - -  07/26/20 1900 125/86 - - (!) 108 - 99 %  07/26/20 1846 123/87 - - (!) 113 - -  07/26/20 1839 121/87 - - (!) 114 - -  07/26/20 1838 - 99.6 F (37.6 C) Oral - 12 100 %    Physical Exam Constitutional:      General: She is not in acute distress.    Appearance: She is well-developed and normal weight. She is not ill-appearing,  toxic-appearing or diaphoretic.  HENT:     Head: Normocephalic.  Pulmonary:     Effort: Pulmonary effort is normal.  Abdominal:     Palpations: Abdomen is soft.  Genitourinary:    Comments: IUD strings visualized outside of vagina; about 2-3 cm. IUD strings trimmed without speculum.  Skin:    General: Skin is warm.  Neurological:     Mental Status: She is alert and oriented to person, place, and time.     Motor: No weakness.     Coordination: Coordination normal.     Deep Tendon Reflexes: Reflexes normal (Negative clonus ).  Psychiatric:        Behavior: Behavior normal.    MAU Course  Procedures  None  MDM  BP not in severe range; labile BP readings  Toradol and tylenol given, patient reports HA is starting to come down Discussed patient with Dr. Debroah Loop, ok for DC home with increase in BP meds, and f/u Friday for BP check   Assessment and Plan   A:  1. Gestational hypertension, antepartum   2. Postpartum state   3. IUD (intrauterine device) in place   4. Acute nonintractable headache, unspecified headache type     P:  Increase procardia to 60 daily Rx: Fioricet Message sent to [facility] for BP check on Friday, she is HD patient.  Strict return precautions to MAU Pelvic rest   Chauntay Paszkiewicz, Harolyn Rutherford, NP 07/27/2020 3:32 PM

## 2020-07-26 NOTE — MAU Note (Signed)
Pt reports a headache that started last night. Pt reports taking ibuprofen for her headache and reports that it did help some initially, but wore off quickly.   Pt reports fingers and toes have been feeling numb and tingling.   Pt reports her IUD was hanging out of her vagina and is unsure if it fell out or if it is still there because she does not see it anymore.

## 2020-07-28 ENCOUNTER — Ambulatory Visit: Payer: Medicaid Other

## 2020-07-31 ENCOUNTER — Ambulatory Visit (INDEPENDENT_AMBULATORY_CARE_PROVIDER_SITE_OTHER): Payer: Medicaid Other

## 2020-07-31 ENCOUNTER — Other Ambulatory Visit: Payer: Self-pay

## 2020-07-31 ENCOUNTER — Encounter: Payer: Self-pay | Admitting: General Practice

## 2020-07-31 VITALS — BP 131/85 | HR 113 | Ht 63.0 in | Wt 168.3 lb

## 2020-07-31 DIAGNOSIS — R030 Elevated blood-pressure reading, without diagnosis of hypertension: Secondary | ICD-10-CM

## 2020-07-31 NOTE — Progress Notes (Addendum)
Pt here today for 1 week PP BP check for GTHN. Pt denies any headaches, visual changes or feeling dizzy.  Pt advised to continue to take BP meds/ Nifedipine 60mg  daily  as prescribed until PP visit with [facility] on 08/18/20. pt advised s/s of increased blood pressure signs of swelling, visual changes or headaches. Pt verbalized understanding.   BP today 131/85  08/20/20  07/31/20

## 2020-08-01 DIAGNOSIS — Z419 Encounter for procedure for purposes other than remedying health state, unspecified: Secondary | ICD-10-CM | POA: Diagnosis not present

## 2020-08-18 DIAGNOSIS — Z30431 Encounter for routine checking of intrauterine contraceptive device: Secondary | ICD-10-CM | POA: Diagnosis not present

## 2020-08-22 ENCOUNTER — Other Ambulatory Visit: Payer: Self-pay | Admitting: *Deleted

## 2020-08-22 ENCOUNTER — Other Ambulatory Visit: Payer: Self-pay

## 2020-08-22 NOTE — Patient Outreach (Signed)
Care Coordination  08/22/2020  Molly Hensley 07-06-1995 [id number]  Successful outreach today with [name]. RNCM explained Medicaid Managed Care Team and case management as a free benefit available to patient. [name] was interested, had another call and asked RNCM to hold. Call was disconnected during the hold. RNCM attempted to reconnect. A voicemail message was left and included RNCM contact information. RNCM will reach out to [name] again over the next 7 days.  Estanislado Emms RN, BSN [place]  [employer]

## 2020-08-22 NOTE — Patient Instructions (Signed)
Visit Information  Ms. Lusk was given information about Medicaid Managed Care team care coordination services and verbally consented to engagement with the [facility] Managed Care team.    Ms. Facemire, our call was disconnected today and I was unable to reconnect with you. You can reach me at [phone] for any healthcare needs and I will reach out to you again over the next 7 days    A HIPAA compliant phone message was left for the patient providing contact information and requesting a return call.  The Managed Medicaid care management team will reach out to the patient again over the next 7 days.   Heidi Dach, RN

## 2020-08-29 DIAGNOSIS — Z419 Encounter for procedure for purposes other than remedying health state, unspecified: Secondary | ICD-10-CM | POA: Diagnosis not present

## 2020-08-30 ENCOUNTER — Other Ambulatory Visit: Payer: Self-pay | Admitting: *Deleted

## 2020-08-30 ENCOUNTER — Other Ambulatory Visit: Payer: Self-pay

## 2020-08-30 NOTE — Patient Instructions (Signed)
Visit Information  Molly Hensley was given information about Medicaid Managed Care team care coordination services as a part of their [facility] Medicaid benefit. Molly Hensley verbally consented to engagement with the [facility] Managed Care team.   For questions related to your [facility] health plan, please call: [phone]  If you would like to schedule transportation through your [facility] plan, please call the following number at least 2 days in advance of your appointment: [phone]   Molly Hensley - following are the goals we discussed in your visit today:  Goals Addressed            This Visit's Progress   . Make and Keep All Appointments       Timeframe:  Long-Range Goal Priority:  Medium Start Date:   08/30/20                          Expected End Date:  10/31/20                     Follow Up Date 10/31/20  - schedule an appointment for new patient visit with PCP - arrange a ride through an agency 1 week before appointment, call [phone] for medical transportation provided by [facility] - keep a calendar with appointment dates    Why is this important?    Part of staying healthy is seeing the doctor for follow-up care.   If you forget your appointments, there are some things you can do to stay on track.        . Protect My Health       Timeframe:  Long-Range Goal Priority:  Medium Start Date:  08/30/20                           Expected End Date:  10/31/20                     Follow Up Date 10/31/20   - practice safe sex - schedule recommended health tests (blood work, mammogram, colonoscopy, pap test) - schedule and keep appointment for annual check-up  - limit the amount of sodium/salt in your diet - exercise for 30 minutes/5 days a week    Why is this important?    Screening tests can find diseases early when they are easier to treat.   Your doctor or nurse will talk with you about which tests are important for you.   Getting shots for common  diseases like the flu and shingles will help prevent them.            Please see education materials related to hypertension provided by MyChart link.    Preventing Hypertension Hypertension, also called high blood pressure, is when the force of blood pumping through the arteries is too strong. Arteries are blood vessels that carry blood from the heart throughout the body. Often, hypertension does not cause symptoms until blood pressure is very high. It is important to have your blood pressure checked regularly. Diet and lifestyle changes can help you prevent hypertension, and they may make you feel better overall and improve your quality of life. If you already have hypertension, you may control it with diet and lifestyle changes, as well as with medicine. How can this condition affect me? Over time, hypertension can damage the arteries and decrease blood flow to important parts of the body, including the brain, heart, and  kidneys. By keeping your blood pressure in a healthy range, you can help prevent complications like heart attack, heart failure, stroke, kidney failure, and vascular dementia. What can increase my risk?  Being an older adult. Older people are more often affected.  Having family members who have had high blood pressure.  Being obese.  Being female. Males are more likely to have high blood pressure.  Drinking too much alcohol or caffeine.  Smoking or using illegal drugs.  Taking certain medicines, such as antidepressants, decongestants, birth control pills, and NSAIDs, such as ibuprofen.  Having thyroid problems.  Having certain tumors. What actions can I take to prevent or manage this condition? Work with your health care provider to make a hypertension prevention plan that works for you. Follow your plan and keep all follow-up visits as told by your health care provider. Diet changes Maintain a healthy diet. This includes:  Eating less salt (sodium). Ask your  health care provider how much sodium is safe for you to have. The general recommendation is to have less than 1 tsp (2,300 mg) of sodium a day. ? Do not add salt to your food. ? Choose low-sodium options when grocery shopping and eating out.  Limiting fats in your diet. You can do this by eating low-fat or fat-free dairy products and by eating less red meat.  Eating more fruits, vegetables, and whole grains. Make a goal to eat: ? 1-2 cups of fresh fruits and vegetables each day. ? 3-4 servings of whole grains each day.  Avoiding foods and beverages that have added sugars.  Eating fish that contain healthy fats (omega-3 fatty acids), such as mackerel or salmon. If you need help putting together a healthy eating plan, try the DASH diet. This diet is high in fruits, vegetables, and whole grains. It is low in sodium, red meat, and added sugars. DASH stands for Dietary Approaches to Stop Hypertension.   Lifestyle changes Lose weight if you are overweight. Losing just 3?5% of your body weight can help prevent or control hypertension. For example, if your present weight is 200 lb (91 kg), a loss of 3-5% of your weight means losing 6-10 lb (2.7-4.5 kg). Ask your health care provider to help you with a diet and exercise plan to safely lose weight. Other recommendations usually include:  Get enough exercise. Do at least 150 minutes of moderate-intensity exercise each week. You could do this in short exercise sessions several times a day, or you could do longer exercise sessions a few times a week. For example, you could take a brisk 10-minute walk or bike ride, 3 times a day, for 5 days a week.  Find ways to reduce stress, such as exercising, meditating, listening to music, or taking a yoga class. If you need help reducing stress, ask your health care provider.  Do not use any products that contain nicotine or tobacco, such as cigarettes, e-cigarettes, and chewing tobacco. If you need help quitting, ask  your health care provider. Chemicals in tobacco and nicotine products raise your blood pressure each time you use them. If you need help quitting, ask your health care provider.  Learn how to check your blood pressure at home. Make sure that you know your personal target blood pressure, as told by your health care provider.  Try to sleep 7-9 hours per night.   Alcohol use  Do not drink alcohol if: ? Your health care provider tells you not to drink. ? You are pregnant, may be  pregnant, or are planning to become pregnant.  If you drink alcohol: ? Limit how much you use to:  0-1 drink a day for women.  0-2 drinks a day for men. ? Be aware of how much alcohol is in your drink. In the [place] one drink equals one 12 oz bottle of beer (355 mL), one 5 oz glass of wine (148 mL), or one 1 oz glass of hard liquor (44 mL). Medicines In addition to diet and lifestyle changes, your health care provider may recommend medicines to help lower your blood pressure. In general:  You may need to try a few different medicines to find what works best for you.  You may need to take more than one medicine.  Take over-the-counter and prescription medicines only as told by your health care provider. Questions to ask your health care provider  What is my blood pressure goal?  How can I lower my risk for high blood pressure?  How should I monitor my blood pressure at home? Where to find support Your health care provider can help you prevent hypertension and help you keep your blood pressure at a healthy level. Your local hospital or your community may also provide support services and prevention programs. The American Heart Association offers an online support network at supportnetwork.heart.org Where to find more information Learn more about hypertension from:  National Heart, Lung, and Blood Institute: PopSteam.iswww.nhlbi.nih.gov  Centers for Disease Control and Prevention: FootballExhibition.com.brwww.cdc.gov  American Academy of  Family Physicians: familydoctor.org Learn more about the DASH diet from:  National Heart, Lung, and Blood Institute: PopSteam.iswww.nhlbi.nih.gov Contact a health care provider if:  You think you are having a reaction to medicines you have taken.  You have recurrent headaches or feel dizzy.  You have swelling in your ankles.  You have trouble with your vision. Get help right away if:  You have sudden, severe chest, back, or abdominal pain or discomfort.  You have shortness of breath.  You have a sudden, severe headache. These symptoms may represent a serious problem that is an emergency. Do not wait to see if the symptoms will go away. Get medical help right away. Call your local emergency services (911 in the [place] Do not drive yourself to the hospital.  Summary  Hypertension often does not cause any symptoms until blood pressure is very high. It is important to get your blood pressure checked regularly.  Diet and lifestyle changes are important steps in preventing hypertension.  By keeping your blood pressure in a healthy range, you may prevent complications like heart attack, heart failure, stroke, and kidney failure.  Work with your health care provider to make a hypertension prevention plan that works for you. This information is not intended to replace advice given to you by your health care provider. Make sure you discuss any questions you have with your health care provider. Document Revised: 05/18/2019 Document Reviewed: 05/18/2019 Elsevier Patient Education  2021 Elsevier Inc.   Patient verbalizes understanding of instructions provided today.   Telephone follow up appointment with Managed Medicaid care management team member scheduled for:10/31/20 @ 10:30a  Heidi DachMelanie A Erine Phenix, RN  Following is a copy of your plan of care:  Patient Care Plan: General Plan of Care (Adult)    Problem Identified: Health Promotion or Disease Self-Management (General Plan of Care)     Long-Range  Goal: Self-Management Plan Developed   Start Date: 08/30/2020  Expected End Date: 10/31/2020  This Visit's Progress: On track  Priority: Medium  Note:    Current Barriers:   Currently UNABLE TO independently self manage needs related to chronic health conditions. Molly Hensley recently delivered a baby in January. During the last 3 weeks of her pregnancy she developed gestational hypertension. She has attended her postpartum visit and had normal readings, stopped taking her BP medication and had normal BP at recheck. Her BP medication was d/c'd. She does not have a PCP and has tried to call and schedule an appointment with no success. She is enjoying being a mother and caring for her baby.  Nurse Case Manager Clinical Goal(s):   patient will work with care management team to address care coordination and chronic disease management needs related to gestational hypertension   Interventions:   Advised patient to schedule appointment on line. RNCM went on line while talking with patient and explained to patient the process and open appointment options  Discussed plans with patient for ongoing care management follow up and provided patient with direct contact information for care management team  Provided patient with mychart educational materials related to healthy diet  Encouraged patient to adhere to a healthy diet, decrease sodium/salt intake, increase exercise to 30 minutes / 5 days a week Self Care Activities:  . Patient will attend church or other social activities . Patient will call provider office for new concerns or questions . Patient will schedule new patient appointment with PCP Patient Goals: - schedule an appointment for new patient visit with PCP - arrange a ride through an agency 1 week before appointment, call [phone] for medical transportation provided by [facility] - keep a calendar with appointment dates  - practice safe sex - schedule recommended health tests (blood work,  mammogram, colonoscopy, pap test) - schedule and keep appointment for annual check-up  - limit the amount of sodium/salt in your diet - exercise for 30 minutes/5 days a week Follow Up Plan: The care management team will reach out to the patient again over the next 60 days.
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
## 2020-09-29 DIAGNOSIS — Z419 Encounter for procedure for purposes other than remedying health state, unspecified: Secondary | ICD-10-CM | POA: Diagnosis not present

## 2020-10-29 DIAGNOSIS — Z419 Encounter for procedure for purposes other than remedying health state, unspecified: Secondary | ICD-10-CM | POA: Diagnosis not present

## 2020-10-31 ENCOUNTER — Other Ambulatory Visit: Payer: Self-pay

## 2020-10-31 ENCOUNTER — Other Ambulatory Visit: Payer: Self-pay | Admitting: *Deleted

## 2020-10-31 NOTE — Patient Instructions (Signed)
Visit Information  Ms. Eastmond was given information about Medicaid Managed Care team care coordination services as a part of their [facility] Medicaid benefit. Dorcas Carrow verbally consented to engagement with the [facility] Managed Care team.   For questions related to your [facility] health plan, please call: [phone]  If you would like to schedule transportation through your [facility] plan, please call the following number at least 2 days in advance of your appointment: [phone]   Call the [place] at [phone], at any time, 24 hours a day, 7 days a week. If you are in danger or need immediate medical attention call 911.  Ms. Slape - following are the goals we discussed in your visit today:  Goals Addressed            This Visit's Progress   . COMPLETED: Make and Keep All Appointments       Timeframe:  Long-Range Goal Priority:  Medium Start Date:   08/30/20                          Expected End Date:  10/31/20                     Follow Up Date 10/31/20  - schedule an appointment for new patient visit with PCP ([place]. or [place].) either of these locations provide Adult care - arrange a ride through an agency 1 week before appointment, call [phone] for medical transportation provided by [facility] - keep a calendar with appointment dates    Why is this important?    Part of staying healthy is seeing the doctor for follow-up care.   If you forget your appointments, there are some things you can do to stay on track.        . COMPLETED: Protect My Health       Timeframe:  Long-Range Goal Priority:  Medium Start Date:  08/30/20                           Expected End Date:  10/31/20                     Follow Up Date 10/31/20   - practice safe sex - schedule recommended health tests (blood work, mammogram, colonoscopy, pap test) - schedule and keep appointment for annual check-up  - limit the  amount of sodium/salt in your diet - exercise for 30 minutes/5 days a week - contact RNCM for any barriers to managing your health     Why is this important?    Screening tests can find diseases early when they are easier to treat.   Your doctor or nurse will talk with you about which tests are important for you.   Getting shots for common diseases like the flu and shingles will help prevent them.            Please see education materials related to dash diet provided by MyChart link.    Cooking With Less Salt Cooking with less salt is one way to reduce the amount of sodium you get from food. Sodium is one of the elements that make up salt. It is found naturally in foods and is also added to certain foods. Depending on your condition and overall health, your health care provider or dietitian may recommend that you reduce your sodium intake. Most  people should have less than 2,300 milligrams (mg) of sodium each day. If you have high blood pressure (hypertension), you may need to limit your sodium to 1,500 mg each day. Follow the tips below to help reduce your sodium intake. What are tips for eating less sodium? Reading food labels  Check the food label before buying or using packaged ingredients. Always check the label for the serving size and sodium content.  Look for products with no more than 140 mg of sodium in one serving.  Check the % Daily Value column to see what percent of the daily recommended amount of sodium is provided in one serving of the product. Foods with 5% or less in this column are considered low in sodium. Foods with 20% or higher are considered high in sodium.  Do not choose foods with salt as one of the first three ingredients on the ingredients list. If salt is one of the first three ingredients, it usually means the item is high in sodium.   Shopping  Buy sodium-free or low-sodium products. Look for the following words on food  labels: ? Low-sodium. ? Sodium-free. ? Reduced-sodium. ? No salt added. ? Unsalted.  Always check the sodium content even if foods are labeled as low-sodium or no salt added.  Buy fresh foods. Cooking  Use herbs, seasonings without salt, and spices as substitutes for salt.  Use sodium-free baking soda when baking.  Grill, braise, or roast foods to add flavor with less salt.  Avoid adding salt to pasta, rice, or hot cereals.  Drain and rinse canned vegetables, beans, and meat before use.  Avoid adding salt when cooking sweets and desserts.  Cook with low-sodium ingredients. What foods are high in sodium? Vegetables Regular canned vegetables (not low-sodium or reduced-sodium). Sauerkraut, pickled vegetables, and relishes. Olives. [place] fries. Onion rings. Regular canned tomato sauce and paste. Regular tomato and vegetable juice. Frozen vegetables in sauces. Grains Instant hot cereals. Bread stuffing, pancake, and biscuit mixes. Croutons. Seasoned rice or pasta mixes. Noodle soup cups. Boxed or frozen macaroni and cheese. Regular salted crackers. Self-rising flour. Rolls. Bagels. Flour tortillas and wraps. Meats and other proteins Meat or fish that is salted, canned, smoked, cured, spiced, or pickled. This includes bacon, ham, sausages, hot dogs, corned beef, chipped beef, meat loaves, salt pork, jerky, pickled herring, anchovies, regular canned tuna, and sardines. Salted nuts. Dairy Processed cheese and cheese spreads. Cheese curds. Blue cheese. Feta cheese. String cheese. Regular cottage cheese. Buttermilk. Canned milk. The items listed above may not be a complete list of foods high in sodium. Actual amounts of sodium may be different depending on processing. Contact a dietitian for more information. What foods are low in sodium? Fruits Fresh, frozen, or canned fruit with no sauce added. Fruit juice. Vegetables Fresh or frozen vegetables with no sauce added. "No salt added"  canned vegetables. "No salt added" tomato sauce and paste. Low-sodium or reduced-sodium tomato and vegetable juice. Grains Noodles, pasta, quinoa, rice. Shredded or puffed wheat or puffed rice. Regular or quick oats (not instant). Low-sodium crackers. Low-sodium bread. Whole-grain bread and whole-grain pasta. Unsalted popcorn. Meats and other proteins Fresh or frozen whole meats, poultry (not injected with sodium), and fish with no sauce added. Unsalted nuts. Dried peas, beans, and lentils without added salt. Unsalted canned beans. Eggs. Unsalted nut butters. Low-sodium canned tuna or chicken. Dairy Milk. Soy milk. Yogurt. Low-sodium cheeses, such as Swiss, Monterey Jack, [place], and [employer]. Sherbet or ice cream (keep to  cup  per serving). Cream cheese. Fats and oils Unsalted butter or margarine. Other foods Homemade pudding. Sodium-free baking soda and baking powder. Herbs and spices. Low-sodium seasoning mixes. Beverages Coffee and tea. Carbonated beverages. The items listed above may not be a complete list of foods low in sodium. Actual amounts of sodium may be different depending on processing. Contact a dietitian for more information. What are some salt alternatives when cooking? The following are herbs, seasonings, and spices that can be used instead of salt to flavor your food. Herbs should be fresh or dried. Do not choose packaged mixes. Next to the name of the herb, spice, or seasoning are some examples of foods you can pair it with. Herbs  Bay leaves - Soups, meat and vegetable dishes, and spaghetti sauce.  Basil - [employer], soups, pasta, and fish dishes.  Cilantro - Meat, poultry, and vegetable dishes.  Chili powder - Marinades and Mexican dishes.  Chives - Salad dressings and potato dishes.  Cumin - Mexican dishes, couscous, and meat dishes.  Dill - Fish dishes, sauces, and salads.  Fennel - Meat and vegetable dishes, breads, and cookies.  Garlic (do not use  garlic salt) - [place] dishes, meat dishes, salad dressings, and sauces.  Marjoram - Soups, potato dishes, and meat dishes.  Oregano - Pizza and spaghetti sauce.  Parsley - Salads, soups, pasta, and meat dishes.  Rosemary - [place] dishes, salad dressings, soups, and red meats.  Saffron - Fish dishes, pasta, and some poultry dishes.  Sage - Stuffings and sauces.  Tarragon - Fish and [employer].  Thyme - Stuffing, meat, and fish dishes. Seasonings  Lemon juice - Fish dishes, poultry dishes, vegetables, and salads.  Vinegar - Salad dressings, vegetables, and fish dishes. Spices  Cinnamon - Sweet dishes, such as cakes, cookies, and puddings.  Cloves - Gingerbread, puddings, and marinades for meats.  Curry - Vegetable dishes, fish and poultry dishes, and stir-fry dishes.  Ginger - Vegetable dishes, fish dishes, and stir-fry dishes.  Nutmeg - Pasta, vegetables, poultry, fish dishes, and custard. Summary  Cooking with less salt is one way to reduce the amount of sodium that you get from food.  Buy sodium-free or low-sodium products.  Check the food label before using or buying packaged ingredients.  Use herbs, seasonings without salt, and spices as substitutes for salt in foods. This information is not intended to replace advice given to you by your health care provider. Make sure you discuss any questions you have with your health care provider. Document Revised: 06/09/2019 Document Reviewed: 06/09/2019 Elsevier Patient Education  2021 [place].   Patient verbalizes understanding of instructions provided today.   No further follow up required: Please reach out to RN Case Manager [phone] with any health management needs   Lurena Joiner RN, BSN [place] RN Care Coordinator   Following is a copy of your plan of care:  Patient Care Plan: General Plan of Care (Adult)    Problem Identified: Health Promotion or Disease  Self-Management (General Plan of Care)     Long-Range Goal: Self-Management Plan Developed Completed 10/31/2020  Start Date: 08/30/2020  Expected End Date: 10/31/2020  This Visit's Progress: On track  Recent Progress: On track  Priority: Medium  Note:    Current Barriers:   Currently UNABLE TO independently self manage needs related to chronic health conditions. Ms. Candy recently delivered a baby in January. During the last 3 weeks of her pregnancy she developed gestational hypertension. She  has attended her postpartum visit and had normal readings, stopped taking her BP medication and had normal BP at recheck. Her BP medication was d/c'd. She has not scheduled an appointment with PCP, will call today. She reports feeling good and denies headaches. She is enjoying being a mother and excited for her first Mother's Day. Ms. Feuerstein has plans of going back to work at [employer].  Nurse Case Manager Clinical Goal(s):   patient will work with care management team to address care coordination and chronic disease management needs related to gestational hypertension-Met   Interventions:   Advised patient to schedule appointment [phone] [facility] or [place].) either of these locations provide Adult care  Discussed plans with patient for ongoing care management follow up and provided patient with direct contact information for care management team  Provided patient with mychart educational materials related to healthy diet  Encouraged patient to adhere to a healthy diet, decrease sodium/salt intake, increase exercise to 30 minutes / 5 days a week  Discussed patient's plans for going back to work Self Care Activities:  . Patient will attend church or other social activities . Patient will call provider office for new concerns or questions . Patient will schedule new patient appointment with PCP Patient Goals: - schedule an appointment for new patient visit with PCP-[facility] [phone]. or [place].) either of these locations provide Adult care - arrange a ride through an agency 1 week before appointment, call [phone] for medical transportation provided by [facility] - keep a calendar with appointment dates  - practice safe sex - schedule recommended health tests (blood work, mammogram, colonoscopy, pap test) - schedule and keep appointment for annual check-up  - limit the amount of sodium/salt in your diet - exercise for 30 minutes/5 days a week Follow Up Plan: No further follow up required: Patient has been provided with contact information to reach out to [facility] should any barriers to managing her health arise
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
## 2020-11-29 DIAGNOSIS — Z419 Encounter for procedure for purposes other than remedying health state, unspecified: Secondary | ICD-10-CM | POA: Diagnosis not present

## 2020-12-03 DIAGNOSIS — R404 Transient alteration of awareness: Secondary | ICD-10-CM | POA: Diagnosis not present

## 2020-12-03 DIAGNOSIS — R58 Hemorrhage, not elsewhere classified: Secondary | ICD-10-CM | POA: Diagnosis not present

## 2020-12-03 DIAGNOSIS — R0902 Hypoxemia: Secondary | ICD-10-CM | POA: Diagnosis not present

## 2020-12-29 DIAGNOSIS — Z419 Encounter for procedure for purposes other than remedying health state, unspecified: Secondary | ICD-10-CM | POA: Diagnosis not present

## 2021-01-18 ENCOUNTER — Emergency Department (HOSPITAL_COMMUNITY)
Admission: EM | Admit: 2021-01-18 | Discharge: 2021-01-19 | Disposition: A | Discharge status: Home / Self Care | Payer: Medicaid Other | Attending: Emergency Medicine | Admitting: Emergency Medicine

## 2021-01-18 ENCOUNTER — Encounter (HOSPITAL_COMMUNITY): Payer: Self-pay

## 2021-01-18 ENCOUNTER — Other Ambulatory Visit: Payer: Self-pay

## 2021-01-18 ENCOUNTER — Ambulatory Visit (HOSPITAL_COMMUNITY)
Admission: EM | Admit: 2021-01-18 | Discharge: 2021-01-18 | Disposition: A | Discharge status: Home / Self Care | Payer: Medicaid Other

## 2021-01-18 ENCOUNTER — Emergency Department (HOSPITAL_COMMUNITY): Payer: Medicaid Other

## 2021-01-18 DIAGNOSIS — T8332XA Displacement of intrauterine contraceptive device, initial encounter: Secondary | ICD-10-CM | POA: Diagnosis not present

## 2021-01-18 DIAGNOSIS — F17210 Nicotine dependence, cigarettes, uncomplicated: Secondary | ICD-10-CM | POA: Diagnosis not present

## 2021-01-18 DIAGNOSIS — R102 Pelvic and perineal pain: Secondary | ICD-10-CM | POA: Diagnosis not present

## 2021-01-18 DIAGNOSIS — N9489 Other specified conditions associated with female genital organs and menstrual cycle: Secondary | ICD-10-CM | POA: Diagnosis not present

## 2021-01-18 DIAGNOSIS — J45909 Unspecified asthma, uncomplicated: Secondary | ICD-10-CM | POA: Diagnosis not present

## 2021-01-18 DIAGNOSIS — Y733 Surgical instruments, materials and gastroenterology and urology devices (including sutures) associated with adverse incidents: Secondary | ICD-10-CM | POA: Insufficient documentation

## 2021-01-18 DIAGNOSIS — Z30430 Encounter for insertion of intrauterine contraceptive device: Secondary | ICD-10-CM | POA: Diagnosis not present

## 2021-01-18 LAB — I-STAT BETA HCG BLOOD, ED (MC, WL, AP ONLY): I-stat hCG, quantitative: <5 m[IU]/mL (ref <5)

## 2021-01-18 NOTE — ED Triage Notes (Signed)
Pt here POV with c/o of IUD being painful. [facility]. Pt went to urgent care and was told her come here for [place] to verify placement.

## 2021-01-18 NOTE — ED Triage Notes (Signed)
Pt in with c/o iud hanging out  States she noticed the strings hanging out this morning and it is poking her and very painful

## 2021-01-18 NOTE — ED Notes (Signed)
Called pt for triage. Pt did not answer X1
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
## 2021-01-18 NOTE — ED Provider Notes (Signed)
Emergency Medicine Provider Triage Evaluation Note  Molly Hensley , a 25 y.o. female  was evaluated in triage.  Pt complains of painful IUD, started today, has some vaginal bleeding no vaginal discharge no pelvic cramping.  Patient states she had this IUD placed 7 months ago after her delivery at the [facility].  She was seen at urgent care who referred her here for further evaluation.  Spoke with Joni Reining APP in the [facility] who endorse that patient would have to be seen here.  She recommends pelvic exam as well as ultrasound for further evaluation, if IUD is protruding through the cervix IUD should be removed.  Dr. Donavan Foil is on call for questions..  Review of Systems  Positive: AlreadyIUD pain, vaginal bleeding Negative: Urinary symptoms, vaginal discharge  Physical Exam  BP 117/88   Pulse 90   Temp 98.7 F (37.1 C) (Oral)   Resp 14   Ht 5\' 3"  (1.6 m)   Wt 83.9 kg   LMP  (LMP Unknown)   SpO2 99%   BMI 32.77 kg/m  Gen:   Awake, no distress   Resp:  Normal effort  MSK:   Moves extremities without difficulty  Other:    Medical Decision Making  Medically screening exam initiated at 5:33 PM.  Appropriate orders placed.  Molly Hensley was informed that the remainder of the evaluation will be completed by another provider, this initial triage assessment does not replace that evaluation, and the importance of remaining in the ED until their evaluation is complete.  Patient presents with IUD displacement, lab work and imaging have been ordered, patient will need further work-up.   , PA-C 01/18/21 1736    01/20/21, MD 01/22/21 1011

## 2021-01-19 DIAGNOSIS — Z30432 Encounter for removal of intrauterine contraceptive device: Secondary | ICD-10-CM | POA: Diagnosis not present

## 2021-01-19 NOTE — Discharge Instructions (Addendum)
Call your Gyn team in the AM to review your ultrasound and schedule a follow up appointment for evaluation. Please use an alternate form of contraception until this issue is addressed.
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
## 2021-01-22 ENCOUNTER — Telehealth: Payer: Self-pay

## 2021-01-22 NOTE — Telephone Encounter (Signed)
Transition Care Management Follow-up Telephone Call Date of discharge and from where: 01/19/2021-[place] How have you been since you were released from the hospital? Patient stated she is doing ok.  Any questions or concerns? No  Items Reviewed: Did the pt receive and understand the discharge instructions provided? Yes  Medications obtained and verified?  No medication given at discharge  Other? No  Any new allergies since your discharge? No  Dietary orders reviewed? N/A Do you have support at home? Yes   Home Care and Equipment/Supplies: Were home health services ordered? not applicable If so, what is the name of the agency? N/A  Has the agency set up a time to come to the patient's home? not applicable Were any new equipment or medical supplies ordered?  No What is the name of the medical supply agency? N/A Were you able to get the supplies/equipment? not applicable Do you have any questions related to the use of the equipment or supplies? No  Functional Questionnaire: (I = Independent and D = Dependent) ADLs: I  Bathing/Dressing- I  Meal Prep- I  Eating- I  Maintaining continence- I  Transferring/Ambulation- I  Managing Meds- I  Follow up appointments reviewed:  PCP Hospital f/u appt confirmed? No   Specialist Hospital f/u appt confirmed? Yes  Scheduled to see Dr. Vergie Living on 01/31/2021 @ 8:55 am. Are transportation arrangements needed? No  If their condition worsens, is the pt aware to call PCP or go to the Emergency Dept.? Yes Was the patient provided with contact information for the PCP's office or ED? Yes Was to pt encouraged to call back with questions or concerns? Yes

## 2021-01-29 DIAGNOSIS — Z419 Encounter for procedure for purposes other than remedying health state, unspecified: Secondary | ICD-10-CM | POA: Diagnosis not present

## 2021-01-31 ENCOUNTER — Other Ambulatory Visit (HOSPITAL_COMMUNITY)
Admission: RE | Admit: 2021-01-31 | Discharge: 2021-01-31 | Disposition: A | Discharge status: Home / Self Care | Payer: Medicaid Other | Source: Ambulatory Visit | Attending: Obstetrics and Gynecology | Admitting: Obstetrics and Gynecology

## 2021-01-31 ENCOUNTER — Encounter: Payer: Self-pay | Admitting: Obstetrics and Gynecology

## 2021-01-31 ENCOUNTER — Ambulatory Visit (INDEPENDENT_AMBULATORY_CARE_PROVIDER_SITE_OTHER): Payer: Medicaid Other | Admitting: Obstetrics and Gynecology

## 2021-01-31 ENCOUNTER — Other Ambulatory Visit: Payer: Self-pay

## 2021-01-31 VITALS — Ht 63.0 in | Wt 177.7 lb

## 2021-01-31 DIAGNOSIS — Z124 Encounter for screening for malignant neoplasm of cervix: Secondary | ICD-10-CM

## 2021-01-31 DIAGNOSIS — Z3044 Encounter for surveillance of vaginal ring hormonal contraceptive device: Secondary | ICD-10-CM

## 2021-01-31 DIAGNOSIS — Z01419 Encounter for gynecological examination (general) (routine) without abnormal findings: Secondary | ICD-10-CM | POA: Insufficient documentation

## 2021-01-31 DIAGNOSIS — Z309 Encounter for contraceptive management, unspecified: Secondary | ICD-10-CM | POA: Insufficient documentation

## 2021-01-31 NOTE — Patient Instructions (Signed)
Health Maintenance, Female Adopting a healthy lifestyle and getting preventive care are important in promoting health and wellness. Ask your health care provider about: The right schedule for you to have regular tests and exams. Things you can do on your own to prevent diseases and keep yourself healthy. What should I know about diet, weight, and exercise? Eat a healthy diet  Eat a diet that includes plenty of vegetables, fruits, low-fat dairy products, and lean protein. Do not eat a lot of foods that are high in solid fats, added sugars, or sodium.  Maintain a healthy weight Body mass index (BMI) is used to identify weight problems. It estimates body fat based on height and weight. Your health care provider can help determineyour BMI and help you achieve or maintain a healthy weight. Get regular exercise Get regular exercise. This is one of the most important things you can do for your health. Most adults should: Exercise for at least 150 minutes each week. The exercise should increase your heart rate and make you sweat (moderate-intensity exercise). Do strengthening exercises at least twice a week. This is in addition to the moderate-intensity exercise. Spend less time sitting. Even light physical activity can be beneficial. Watch cholesterol and blood lipids Have your blood tested for lipids and cholesterol at 25 years of age, then havethis test every 5 years. Have your cholesterol levels checked more often if: Your lipid or cholesterol levels are high. You are older than 25 years of age. You are at high risk for heart disease. What should I know about cancer screening? Depending on your health history and family history, you may need to have cancer screening at various ages. This may include screening for: Breast cancer. Cervical cancer. Colorectal cancer. Skin cancer. Lung cancer. What should I know about heart disease, diabetes, and high blood pressure? Blood pressure and heart  disease High blood pressure causes heart disease and increases the risk of stroke. This is more likely to develop in people who have high blood pressure readings, are of African descent, or are overweight. Have your blood pressure checked: Every 3-5 years if you are 18-39 years of age. Every year if you are 40 years old or older. Diabetes Have regular diabetes screenings. This checks your fasting blood sugar level. Have the screening done: Once every three years after age 40 if you are at a normal weight and have a low risk for diabetes. More often and at a younger age if you are overweight or have a high risk for diabetes. What should I know about preventing infection? Hepatitis B If you have a higher risk for hepatitis B, you should be screened for this virus. Talk with your health care provider to find out if you are at risk forhepatitis B infection. Hepatitis C Testing is recommended for: Everyone born from 1945 through 1965. Anyone with known risk factors for hepatitis C. Sexually transmitted infections (STIs) Get screened for STIs, including gonorrhea and chlamydia, if: You are sexually active and are younger than 24 years of age. You are older than 24 years of age and your health care provider tells you that you are at risk for this type of infection. Your sexual activity has changed since you were last screened, and you are at increased risk for chlamydia or gonorrhea. Ask your health care provider if you are at risk. Ask your health care provider about whether you are at high risk for HIV. Your health care provider may recommend a prescription medicine to help   prevent HIV infection. If you choose to take medicine to prevent HIV, you should first get tested for HIV. You should then be tested every 3 months for as long as you are taking the medicine. Pregnancy If you are about to stop having your period (premenopausal) and you may become pregnant, seek counseling before you get  pregnant. Take 400 to 800 micrograms (mcg) of folic acid every day if you become pregnant. Ask for birth control (contraception) if you want to prevent pregnancy. Osteoporosis and menopause Osteoporosis is a disease in which the bones lose minerals and strength with aging. This can result in bone fractures. If you are 65 years old or older, or if you are at risk for osteoporosis and fractures, ask your health care provider if you should: Be screened for bone loss. Take a calcium or vitamin D supplement to lower your risk of fractures. Be given hormone replacement therapy (HRT) to treat symptoms of menopause. Follow these instructions at home: Lifestyle Do not use any products that contain nicotine or tobacco, such as cigarettes, e-cigarettes, and chewing tobacco. If you need help quitting, ask your health care provider. Do not use street drugs. Do not share needles. Ask your health care provider for help if you need support or information about quitting drugs. Alcohol use Do not drink alcohol if: Your health care provider tells you not to drink. You are pregnant, may be pregnant, or are planning to become pregnant. If you drink alcohol: Limit how much you use to 0-1 drink a day. Limit intake if you are breastfeeding. Be aware of how much alcohol is in your drink. In the [place] one drink equals one 12 oz bottle of beer (355 mL), one 5 oz glass of wine (148 mL), or one 1 oz glass of hard liquor (44 mL). General instructions Schedule regular health, dental, and eye exams. Stay current with your vaccines. Tell your health care provider if: You often feel depressed. You have ever been abused or do not feel safe at home. Summary Adopting a healthy lifestyle and getting preventive care are important in promoting health and wellness. Follow your health care provider's instructions about healthy diet, exercising, and getting tested or screened for diseases. Follow your health care provider's  instructions on monitoring your cholesterol and blood pressure. This information is not intended to replace advice given to you by your health care provider. Make sure you discuss any questions you have with your healthcare provider. Document Revised: 06/10/2018 Document Reviewed: 06/10/2018 Elsevier Patient Education  2022 [employer]  

## 2021-01-31 NOTE — Progress Notes (Signed)
Molly Hensley is a 25 y.o. G25P1011 female here for a routine annual gynecologic exam.  Current complaints: none.   Denies abnormal vaginal bleeding, discharge, pelvic pain, problems with intercourse or other gynecologic concerns.    Sees [facility] for her Nuva Ring  Not sexual active.  Gynecologic History No LMP recorded (lmp unknown). (Menstrual status: Other). Contraception: NuvaRing vaginal inserts Last Pap: uncertain. Results were: uncertain Last mammogram: NA. Results were: NA  Obstetric History OB History  Gravida Para Term Preterm AB Living  2 1 1   1 1   SAB IAB Ectopic Multiple Live Births    1   0 1    # Outcome Date GA Lbr Len/2nd Weight Sex Delivery Anes PTL Lv  2 Term 07/20/20 [redacted]w[redacted]d 04:21 / 00:59 6 lb 15.6 oz (3.164 kg) F Vag-Spont EPI  LIV     Birth Comments: wnl  1 IAB 03/17/19            Past Medical History:  Diagnosis Date   Asthma    Childhood   Chlamydia    Headache     Past Surgical History:  Procedure Laterality Date   INDUCED ABORTION     TOOTH EXTRACTION      No current outpatient medications on file prior to visit.   No current facility-administered medications on file prior to visit.    No Known Allergies  Social History   Socioeconomic History   Marital status: Single    Spouse name: Not on file   Number of children: Not on file   Years of education: Not on file   Highest education level: Not on file  Occupational History   Not on file  Tobacco Use   Smoking status: Every Day    Packs/day: 0.25    Types: Cigarettes   Smokeless tobacco: Never   Tobacco comments:    cutting down  Vaping Use   Vaping Use: Never used  Substance and Sexual Activity   Alcohol use: No    Alcohol/week: 0.0 standard drinks   Drug use: Not Currently    Types: Marijuana    Comment: not since first trimester   Sexual activity: Not Currently    Birth control/protection: None  Other Topics Concern   Not on file  Social History Narrative   Not on  file   Social Determinants of Health   Financial Resource Strain: Not on file  Food Insecurity: Not on file  Transportation Needs: Not on file  Physical Activity: Not on file  Stress: Not on file  Social Connections: Not on file  Intimate Partner Violence: Not on file    Family History  Problem Relation Age of Onset   Hypertension Mother    Epilepsy Mother    Heart disease Mother    Cancer Maternal Grandmother     The following portions of the patient's history were reviewed and updated as appropriate: allergies, current medications, past family history, past medical history, past social history, past surgical history and problem list.  Review of Systems Pertinent items noted in HPI and remainder of comprehensive ROS otherwise negative.   Objective:  Ht 5\' 3"  (1.6 m)   Wt 177 lb 11.2 oz (80.6 kg)   LMP  (LMP Unknown) Comment: Pt has the 03/19/19 Ring  BMI 31.48 kg/m  CONSTITUTIONAL: Well-developed, well-nourished female in no acute distress.  HENT:  Normocephalic, atraumatic, External right and left ear normal. Oropharynx is clear and moist EYES: Conjunctivae and EOM are normal. Pupils are  equal, round, and reactive to light. No scleral icterus.  NECK: Normal range of motion, supple, no masses.  Normal thyroid.  SKIN: Skin is warm and dry. No rash noted. Not diaphoretic. No erythema. No pallor. NEUROLGIC: Alert and oriented to person, place, and time. Normal reflexes, muscle tone coordination. No cranial nerve deficit noted. PSYCHIATRIC: Normal mood and affect. Normal behavior. Normal judgment and thought content. CARDIOVASCULAR: Normal heart rate noted, regular rhythm RESPIRATORY: Clear to auscultation bilaterally. Effort and breath sounds normal, no problems with respiration noted. BREASTS: Symmetric in size. No masses, skin changes, nipple drainage, or lymphadenopathy. ABDOMEN: Soft, normal bowel sounds, no distention noted.  No tenderness, rebound or guarding.  PELVIC:  Normal appearing external genitalia; normal appearing vaginal mucosa and cervix.  No abnormal discharge noted.  Pap smear obtained.  Normal uterine size, no other palpable masses, no uterine or adnexal tenderness. MUSCULOSKELETAL: Normal range of motion. No tenderness.  No cyanosis, clubbing, or edema.  2+ distal pulses.   Assessment:  Annual gynecologic examination with pap smear  Contraception management Plan:  Will follow up results of pap smear and manage accordingly. Pt declined STD testing. Continue with Nuva Ring.  Routine preventative health maintenance measures emphasized. Please refer to After Visit Summary for other counseling recommendations.    Hermina Staggers, MD, FACOG Attending Obstetrician & Gynecologist Center for [facility], [facility]

## 2021-02-02 LAB — CYTOLOGY - PAP: Diagnosis: NEGATIVE

## 2021-03-01 DIAGNOSIS — Z419 Encounter for procedure for purposes other than remedying health state, unspecified: Secondary | ICD-10-CM | POA: Diagnosis not present

## 2021-03-31 DIAGNOSIS — Z419 Encounter for procedure for purposes other than remedying health state, unspecified: Secondary | ICD-10-CM | POA: Diagnosis not present

## 2021-04-04 ENCOUNTER — Encounter (HOSPITAL_COMMUNITY): Payer: Self-pay

## 2021-04-04 ENCOUNTER — Ambulatory Visit (HOSPITAL_COMMUNITY)
Admission: EM | Admit: 2021-04-04 | Discharge: 2021-04-04 | Disposition: A | Discharge status: Home / Self Care | Payer: Medicaid Other | Attending: Emergency Medicine | Admitting: Emergency Medicine

## 2021-04-04 DIAGNOSIS — K2900 Acute gastritis without bleeding: Secondary | ICD-10-CM

## 2021-04-04 MED ORDER — ONDANSETRON 4 MG PO TBDP: 4.0000 mg | ORAL_TABLET | Freq: Three times a day (TID) | ORAL | 0 refills | Status: DC | PRN

## 2021-04-04 NOTE — Discharge Instructions (Signed)
Take the Zofran every 8 hours as needed for nausea and vomiting.   Try a BRAT (bananas, rice, applesause, toast) or bland food diet for the next few days.  Stick with foods that are gentle on your stomach.  Avoid foods that are difficult to digest, such as diary.  As you feel better, you can start eating like you do normally.    You can use ginger (ginger ale, ginger candy) and mint for nausea.    Make sure you are drinking plenty of fluids, such as water, powerade/gatorade, pedialyte, juices, and teas.    Return or go to the Emergency Department if symptoms worsen or do not improve in the next few days.  

## 2021-04-04 NOTE — ED Provider Notes (Signed)
[facility]    CSN: [id number] Arrival date & time: 04/04/21  1313      History   Chief Complaint Chief Complaint  Patient presents with   Abdominal Pain   Emesis    HPI Molly Hensley is a 25 y.o. female.   Patient here for evaluation of generalized abdominal pain, vomiting, and diarrhea that started this morning.  Denies eating anything unusual.  Denies any recent sick contacts.  Reports abdominal pain is generalized and cramping.  Reports taking Pepto-Bismol with minimal symptom relief.  Denies any trauma, injury, or other precipitating event.  Denies any specific alleviating or aggravating factors.  Denies any fevers, chest pain, shortness of breath, numbness, tingling, weakness, or headaches.    The history is provided by the patient.  Abdominal Pain Associated symptoms: diarrhea and vomiting   Emesis Associated symptoms: abdominal pain and diarrhea    Past Medical History:  Diagnosis Date   Asthma    Childhood   Chlamydia    Headache     Patient Active Problem List   Diagnosis Date Noted   Visit for routine gyn exam 01/31/2021   Contraception management 01/31/2021   Acute on chronic anemia 07/21/2020   Screening for lipid disorders 03/17/2015   Tobacco use disorder 03/08/2014    Past Surgical History:  Procedure Laterality Date   INDUCED ABORTION     TOOTH EXTRACTION      OB History     Gravida  2   Para  1   Term  1   Preterm      AB  1   Living  1      SAB      IAB  1   Ectopic      Multiple  0   Live Births  1            Home Medications    Prior to Admission medications   Medication Sig Start Date End Date Taking? Authorizing Provider  ondansetron (ZOFRAN ODT) 4 MG disintegrating tablet Take 1 tablet (4 mg total) by mouth every 8 (eight) hours as needed for nausea or vomiting. 04/04/21  Yes Ivette Loyal, NP    Family History Family History  Problem Relation Age of Onset   Hypertension Mother     Epilepsy Mother    Heart disease Mother    Cancer Maternal Grandmother     Social History Social History   Tobacco Use   Smoking status: Every Day    Packs/day: 0.25    Types: Cigarettes   Smokeless tobacco: Never   Tobacco comments:    cutting down  Vaping Use   Vaping Use: Never used  Substance Use Topics   Alcohol use: No    Alcohol/week: 0.0 standard drinks   Drug use: Not Currently    Types: Marijuana    Comment: not since first trimester     Allergies   Patient has no known allergies.   Review of Systems Review of Systems  Gastrointestinal:  Positive for abdominal pain, diarrhea and vomiting.  All other systems reviewed and are negative.   Physical Exam Triage Vital Signs ED Triage Vitals  Enc Vitals Group     BP 04/04/21 1414 (!) 142/70     Pulse Rate 04/04/21 1414 86     Resp 04/04/21 1414 17     Temp 04/04/21 1414 99.8 F (37.7 C)     Temp Source 04/04/21 1414 Oral     SpO2  04/04/21 1414 100 %     Weight --      Height --      Head Circumference --      Peak Flow --      Pain Score 04/04/21 1418 6     Pain Loc --      Pain Edu? --      Excl. in [place]? --    No data found.  Updated Vital Signs BP (!) 142/70 (BP Location: Left Arm)   Pulse 86   Temp 99.8 F (37.7 C) (Oral)   Resp 17   SpO2 100%   Visual Acuity Right Eye Distance:   Left Eye Distance:   Bilateral Distance:    Right Eye Near:   Left Eye Near:    Bilateral Near:     Physical Exam Vitals and nursing note reviewed.  Constitutional:      General: She is not in acute distress.    Appearance: Normal appearance. She is not ill-appearing, toxic-appearing or diaphoretic.  HENT:     Head: Normocephalic and atraumatic.  Eyes:     Conjunctiva/sclera: Conjunctivae normal.  Cardiovascular:     Rate and Rhythm: Normal rate.     Pulses: Normal pulses.     Heart sounds: Normal heart sounds.  Pulmonary:     Effort: Pulmonary effort is normal.     Breath sounds: Normal breath  sounds.  Abdominal:     General: Abdomen is flat. Bowel sounds are normal. There is no distension. There are no signs of injury.     Palpations: Abdomen is soft. There is no hepatomegaly or splenomegaly.     Tenderness: There is generalized abdominal tenderness. There is no guarding or rebound. Negative signs include Murphy's sign, Rovsing's sign, McBurney's sign, psoas sign and obturator sign.     Hernia: No hernia is present.  Musculoskeletal:        General: Normal range of motion.     Cervical back: Normal range of motion.  Skin:    General: Skin is warm and dry.  Neurological:     General: No focal deficit present.     Mental Status: She is alert and oriented to person, place, and time.  Psychiatric:        Mood and Affect: Mood normal.     UC Treatments / Results  Labs (all labs ordered are listed, but only abnormal results are displayed) Labs Reviewed - No data to display  EKG   Radiology No results found.  Procedures Procedures (including critical care time)  Medications Ordered in UC Medications - No data to display  Initial Impression / Assessment and Plan / UC Course  I have reviewed the triage vital signs and the nursing notes.  Pertinent labs & imaging results that were available during my care of the patient were reviewed by me and considered in my medical decision making (see chart for details).    Assessment negative for red flags or concerns.  Likely acute gastritis versus food poisoning.  May take Zofran as needed.  Recommend a brat or bland food diet and then advance as tolerated.  May use ginger or mint for nausea.  Encourage fluids and rest.  Follow-up for reevaluation as needed. Final Clinical Impressions(s) / UC Diagnoses   Final diagnoses:  Acute gastritis without hemorrhage, unspecified gastritis type     Discharge Instructions      Take the Zofran every 8 hours as needed for nausea and vomiting.  Try a [employer] fast streamer (  bananas, rice, applesause,  toast) or bland food diet for the next few days.  Stick with foods that are gentle on your stomach.  Avoid foods that are difficult to digest, such as diary.  As you feel better, you can start eating like you do normally.    You can use ginger (ginger ale, ginger candy) and mint for nausea.    Make sure you are drinking plenty of fluids, such as water, powerade/gatorade, pedialyte, juices, and teas.    Return or go to the Emergency Department if symptoms worsen or do not improve in the next few days.      ED Prescriptions     Medication Sig Dispense Auth. Provider   ondansetron (ZOFRAN ODT) 4 MG disintegrating tablet Take 1 tablet (4 mg total) by mouth every 8 (eight) hours as needed for nausea or vomiting. 20 tablet Ivette Loyal, NP      PDMP not reviewed this encounter.   Ivette Loyal, NP 04/04/21 [phone]

## 2021-04-04 NOTE — ED Triage Notes (Signed)
Pt presents with generalized abdominal pain and vomiting since waking up this morning.

## 2021-04-13 DIAGNOSIS — Z3009 Encounter for other general counseling and advice on contraception: Secondary | ICD-10-CM | POA: Diagnosis not present

## 2021-04-13 DIAGNOSIS — Z3044 Encounter for surveillance of vaginal ring hormonal contraceptive device: Secondary | ICD-10-CM | POA: Diagnosis not present

## 2021-05-01 DIAGNOSIS — Z419 Encounter for procedure for purposes other than remedying health state, unspecified: Secondary | ICD-10-CM | POA: Diagnosis not present

## 2021-05-31 DIAGNOSIS — Z419 Encounter for procedure for purposes other than remedying health state, unspecified: Secondary | ICD-10-CM | POA: Diagnosis not present

## 2021-06-03 ENCOUNTER — Ambulatory Visit (HOSPITAL_COMMUNITY)
Admission: EM | Admit: 2021-06-03 | Discharge: 2021-06-03 | Discharge status: Against Medical Advice | Payer: Medicaid Other

## 2021-06-03 ENCOUNTER — Other Ambulatory Visit: Payer: Self-pay

## 2021-06-04 ENCOUNTER — Other Ambulatory Visit: Payer: Self-pay

## 2021-06-04 ENCOUNTER — Encounter (HOSPITAL_COMMUNITY): Payer: Self-pay

## 2021-06-04 ENCOUNTER — Ambulatory Visit (HOSPITAL_COMMUNITY)
Admission: EM | Admit: 2021-06-04 | Discharge: 2021-06-04 | Disposition: A | Discharge status: Home / Self Care | Payer: Medicaid Other | Attending: Internal Medicine | Admitting: Internal Medicine

## 2021-06-04 DIAGNOSIS — B9789 Other viral agents as the cause of diseases classified elsewhere: Secondary | ICD-10-CM | POA: Insufficient documentation

## 2021-06-04 DIAGNOSIS — F17210 Nicotine dependence, cigarettes, uncomplicated: Secondary | ICD-10-CM | POA: Diagnosis not present

## 2021-06-04 DIAGNOSIS — J028 Acute pharyngitis due to other specified organisms: Secondary | ICD-10-CM | POA: Insufficient documentation

## 2021-06-04 DIAGNOSIS — Z8616 Personal history of COVID-19: Secondary | ICD-10-CM | POA: Insufficient documentation

## 2021-06-04 DIAGNOSIS — J029 Acute pharyngitis, unspecified: Secondary | ICD-10-CM

## 2021-06-04 DIAGNOSIS — Z20822 Contact with and (suspected) exposure to covid-19: Secondary | ICD-10-CM | POA: Insufficient documentation

## 2021-06-04 LAB — SARS CORONAVIRUS 2 (TAT 6-24 HRS): SARS Coronavirus 2: NEGATIVE

## 2021-06-04 MED ORDER — BENZONATATE 100 MG PO CAPS: 100.0000 mg | ORAL_CAPSULE | Freq: Three times a day (TID) | ORAL | 0 refills | Status: DC | PRN

## 2021-06-04 NOTE — ED Provider Notes (Signed)
[facility]    CSN: [id number] Arrival date & time: 06/04/21  0830      History   Chief Complaint Chief Complaint  Patient presents with   URI   Chills   Headache    HPI Molly Hensley is a 25 y.o. female comes to the urgent care with a 2 to 3-day history of generalized body aches, headaches, chills without objective fever.  Patient says symptoms started over the weekend has been persistent.  She also complains of a sore throat and difficulty swallowing.  No nausea or vomiting.  No diarrhea.  Patient denies any sick contacts.  She smokes cigarettes.  Patient had COVID-19 infection about 6 months ago.  She is fully vaccinated and boosted.  No known sick contacts.  She works in a [employer].  HPI  Past Medical History:  Diagnosis Date   Asthma    Childhood   Chlamydia    Headache     Patient Active Problem List   Diagnosis Date Noted   Visit for routine gyn exam 01/31/2021   Contraception management 01/31/2021   Acute on chronic anemia 07/21/2020   Screening for lipid disorders 03/17/2015   Tobacco use disorder 03/08/2014    Past Surgical History:  Procedure Laterality Date   INDUCED ABORTION     TOOTH EXTRACTION      OB History     Gravida  2   Para  1   Term  1   Preterm      AB  1   Living  1      SAB      IAB  1   Ectopic      Multiple  0   Live Births  1            Home Medications    Prior to Admission medications   Medication Sig Start Date End Date Taking? Authorizing Provider  benzonatate (TESSALON) 100 MG capsule Take 1 capsule (100 mg total) by mouth 3 (three) times daily as needed for cough. 06/04/21  Yes Rondalyn Belford, Britta Mccreedy, MD  ondansetron (ZOFRAN ODT) 4 MG disintegrating tablet Take 1 tablet (4 mg total) by mouth every 8 (eight) hours as needed for nausea or vomiting. 04/04/21   Ivette Loyal, NP    Family History Family History  Problem Relation Age of Onset   Hypertension Mother    Epilepsy Mother     Heart disease Mother    Cancer Maternal Grandmother     Social History Social History   Tobacco Use   Smoking status: Every Day    Packs/day: 0.25    Types: Cigarettes   Smokeless tobacco: Never   Tobacco comments:    cutting down  Vaping Use   Vaping Use: Never used  Substance Use Topics   Alcohol use: No    Alcohol/week: 0.0 standard drinks   Drug use: Not Currently    Types: Marijuana    Comment: not since first trimester     Allergies   Patient has no known allergies.   Review of Systems Review of Systems  Constitutional:  Positive for chills. Negative for fever.  HENT:  Positive for congestion and sore throat. Negative for postnasal drip, sinus pressure and voice change.   Respiratory: Negative.  Negative for chest tightness, shortness of breath and wheezing.   Cardiovascular:  Negative for chest pain and palpitations.  Gastrointestinal: Negative.   Neurological: Negative.     Physical Exam Triage Vital Signs  ED Triage Vitals  Enc Vitals Group     BP 06/04/21 0927 111/79     Pulse Rate 06/04/21 0927 (!) 105     Resp 06/04/21 0927 17     Temp 06/04/21 0927 98.9 F (37.2 C)     Temp Source 06/04/21 0927 Oral     SpO2 06/04/21 0927 100 %     Weight --      Height --      Head Circumference --      Peak Flow --      Pain Score 06/04/21 0926 7     Pain Loc --      Pain Edu? --      Excl. in [place]? --    No data found.  Updated Vital Signs BP 111/79 (BP Location: Left Arm)   Pulse (!) 105   Temp 98.9 F (37.2 C) (Oral)   Resp 17   SpO2 100%   Visual Acuity Right Eye Distance:   Left Eye Distance:   Bilateral Distance:    Right Eye Near:   Left Eye Near:    Bilateral Near:     Physical Exam Vitals reviewed.  Constitutional:      General: She is not in acute distress.    Appearance: She is not ill-appearing.  Cardiovascular:     Rate and Rhythm: Normal rate and regular rhythm.     Heart sounds: Normal heart sounds.  Pulmonary:      Effort: Pulmonary effort is normal. No respiratory distress.     Breath sounds: Normal breath sounds. No wheezing or rhonchi.  Abdominal:     General: Bowel sounds are normal. There is no distension.     Palpations: Abdomen is soft.     Tenderness: There is no guarding.  Neurological:     Mental Status: She is alert.     GCS: GCS eye subscore is 4. GCS verbal subscore is 5. GCS motor subscore is 6.     UC Treatments / Results  Labs (all labs ordered are listed, but only abnormal results are displayed) Labs Reviewed  SARS CORONAVIRUS 2 (TAT 6-24 HRS)    EKG   Radiology No results found.  Procedures Procedures (including critical care time)  Medications Ordered in UC Medications - No data to display  Initial Impression / Assessment and Plan / UC Course  I have reviewed the triage vital signs and the nursing notes.  Pertinent labs & imaging results that were available during my care of the patient were reviewed by me and considered in my medical decision making (see chart for details).     1.  Acute viral pharyngitis: Warm salt water gargle Continue home remedies Tessalon Perles as needed for cough COVID-19 PCR test has been sent We will call you with recommendations if labs are abnormal Maintain adequate hydration Return to urgent care if symptoms worsen. Final Clinical Impressions(s) / UC Diagnoses   Final diagnoses:  Acute viral pharyngitis     Discharge Instructions      Continue home remedies Take prescribed medications We will call you with recommendations if labs are abnormal Maintain adequate hydration Return to urgent care if symptoms worsen.   ED Prescriptions     Medication Sig Dispense Auth. Provider   benzonatate (TESSALON) 100 MG capsule Take 1 capsule (100 mg total) by mouth 3 (three) times daily as needed for cough. 21 capsule Arshiya Jakes, Myrene Galas, MD      PDMP not reviewed this encounter.  Chase Picket, MD 06/04/21 1136

## 2021-06-04 NOTE — ED Triage Notes (Signed)
Pt presents with chills, headache, nasal drainage, and sore throat x 2 days.

## 2021-06-04 NOTE — Discharge Instructions (Signed)
Continue home remedies Take prescribed medications We will call you with recommendations if labs are abnormal Maintain adequate hydration Return to urgent care if symptoms worsen.

## 2021-07-01 DIAGNOSIS — Z419 Encounter for procedure for purposes other than remedying health state, unspecified: Secondary | ICD-10-CM | POA: Diagnosis not present

## 2021-07-25 ENCOUNTER — Other Ambulatory Visit (HOSPITAL_COMMUNITY): Payer: Self-pay

## 2021-08-01 DIAGNOSIS — Z419 Encounter for procedure for purposes other than remedying health state, unspecified: Secondary | ICD-10-CM | POA: Diagnosis not present

## 2021-08-29 DIAGNOSIS — Z419 Encounter for procedure for purposes other than remedying health state, unspecified: Secondary | ICD-10-CM | POA: Diagnosis not present

## 2021-09-10 ENCOUNTER — Ambulatory Visit (HOSPITAL_COMMUNITY)
Admission: EM | Admit: 2021-09-10 | Discharge: 2021-09-10 | Disposition: A | Discharge status: Home / Self Care | Payer: Medicaid Other | Attending: Nurse Practitioner | Admitting: Nurse Practitioner

## 2021-09-10 ENCOUNTER — Encounter (HOSPITAL_COMMUNITY): Payer: Self-pay

## 2021-09-10 ENCOUNTER — Other Ambulatory Visit: Payer: Self-pay

## 2021-09-10 DIAGNOSIS — J069 Acute upper respiratory infection, unspecified: Secondary | ICD-10-CM | POA: Insufficient documentation

## 2021-09-10 DIAGNOSIS — Z20822 Contact with and (suspected) exposure to covid-19: Secondary | ICD-10-CM | POA: Insufficient documentation

## 2021-09-10 DIAGNOSIS — R051 Acute cough: Secondary | ICD-10-CM | POA: Insufficient documentation

## 2021-09-10 MED ORDER — PSEUDOEPH-BROMPHEN-DM 30-2-10 MG/5ML PO SYRP: 5.0000 mL | ORAL_SOLUTION | Freq: Four times a day (QID) | ORAL | 0 refills | Status: AC | PRN

## 2021-09-10 MED ORDER — AZITHROMYCIN 250 MG PO TABS: 250.0000 mg | ORAL_TABLET | Freq: Every day | ORAL | 0 refills | Status: AC

## 2021-09-10 NOTE — ED Triage Notes (Signed)
Pt c/o cough, congestion, fever, and chills x3 wks. Using OTC meds with no relief.  ?

## 2021-09-10 NOTE — ED Provider Notes (Signed)
?[facility] ? ? ? ?CSN: [id number] ?Arrival date & time: 09/10/21  1019 ? ? ?  ? ?History   ?Chief Complaint ?Chief Complaint  ?Patient presents with  ? Cough  ? ? ?HPI ?Molly Hensley is a 26 y.o. female.  ? ?The patient is a 26 year old female who presents for upper respiratory symptoms for 3 weeks.  She states that her symptoms improved but then worsened over the past 2 to 3 days.  She complains of fever, headache, cough, and nasal congestion.  She states the cough is productive of greenish-yellow sputum.  Her last fever was about 2 to 3 days ago when she states it was around 100.  The patient's daughter has also been sick and she feels like they have been passing it back and forth.  She has been using over-the-counter medications with no relief.  She is concerned because the symptoms have been around for so long with minimal improvement. ? ? ?Cough ?Cough characteristics:  Productive ?Sputum characteristics:  Green and yellow ?Progression:  Waxing and waning ?Context: upper respiratory infection   ?Associated symptoms: fever and rhinorrhea   ?Associated symptoms: no shortness of breath and no wheezing   ? ?Past Medical History:  ?Diagnosis Date  ? Asthma   ? Childhood  ? Chlamydia   ? Headache   ? ? ?Patient Active Problem List  ? Diagnosis Date Noted  ? Visit for routine gyn exam 01/31/2021  ? Contraception management 01/31/2021  ? Acute on chronic anemia 07/21/2020  ? Screening for lipid disorders 03/17/2015  ? Tobacco use disorder 03/08/2014  ? ? ?Past Surgical History:  ?Procedure Laterality Date  ? INDUCED ABORTION    ? TOOTH EXTRACTION    ? ? ?OB History   ? ? Gravida  ?2  ? Para  ?1  ? Term  ?1  ? Preterm  ?   ? AB  ?1  ? Living  ?1  ?  ? ? SAB  ?   ? IAB  ?1  ? Ectopic  ?   ? Multiple  ?0  ? Live Births  ?1  ?   ?  ?  ? ? ? ?Home Medications   ? ?Prior to Admission medications   ?Medication Sig Start Date End Date Taking? Authorizing Provider  ?azithromycin (ZITHROMAX) 250 MG tablet Take 1  tablet (250 mg total) by mouth daily for 5 days. Take first 2 tablets together, then 1 every day until finished. 09/10/21 09/15/21 Yes Elna Radovich-Warren, Sadie Haber, NP  ?brompheniramine-pseudoephedrine-DM 30-2-10 MG/5ML syrup Take 5 mLs by mouth 4 (four) times daily as needed for up to 7 days. 09/10/21 09/17/21 Yes Byran Bilotti-Warren, Sadie Haber, NP  ? ? ?Family History ?Family History  ?Problem Relation Age of Onset  ? Hypertension Mother   ? Epilepsy Mother   ? Heart disease Mother   ? Cancer Maternal Grandmother   ? ? ?Social History ?Social History  ? ?Tobacco Use  ? Smoking status: Every Day  ?  Packs/day: 0.25  ?  Types: Cigarettes  ? Smokeless tobacco: Never  ? Tobacco comments:  ?  cutting down  ?Vaping Use  ? Vaping Use: Never used  ?Substance Use Topics  ? Alcohol use: No  ?  Alcohol/week: 0.0 standard drinks  ? Drug use: Not Currently  ?  Types: Marijuana  ?  Comment: not since first trimester  ? ? ? ?Allergies   ?Patient has no known allergies. ? ? ?Review of Systems ?Review of Systems  ?  Constitutional:  Positive for appetite change, fatigue and fever. Negative for activity change.  ?HENT:  Positive for congestion, rhinorrhea and sinus pressure. Negative for sinus pain and sneezing.   ?Eyes: Negative.   ?Respiratory:  Positive for cough. Negative for shortness of breath and wheezing.   ?Cardiovascular: Negative.   ?Gastrointestinal: Negative.   ?Skin: Negative.   ?Psychiatric/Behavioral: Negative.    ? ? ?Physical Exam ?Triage Vital Signs ?ED Triage Vitals [09/10/21 1234]  ?Enc Vitals Group  ?   BP 131/83  ?   Pulse Rate (!) 113  ?   Resp 18  ?   Temp 99.7 ?F (37.6 ?C)  ?   Temp src   ?   SpO2 97 %  ?   Weight   ?   Height   ?   Head Circumference   ?   Peak Flow   ?   Pain Score 6  ?   Pain Loc   ?   Pain Edu?   ?   Excl. in GC?   ? ?No data found. ? ?Updated Vital Signs ?BP 131/83 (BP Location: Left Arm)   Pulse (!) 113   Temp 99.7 ?F (37.6 ?C)   Resp 18   LMP 08/12/2021   SpO2 97%   Breastfeeding No   ? ?Visual Acuity ?Right Eye Distance:   ?Left Eye Distance:   ?Bilateral Distance:   ? ?Right Eye Near:   ?Left Eye Near:    ?Bilateral Near:    ? ?Physical Exam ?Constitutional:   ?   General: She is not in acute distress. ?   Appearance: Normal appearance.  ?HENT:  ?   Head: Normocephalic and atraumatic.  ?   Right Ear: Tympanic membrane, ear canal and external ear normal.  ?   Left Ear: Tympanic membrane, ear canal and external ear normal.  ?   Nose: Congestion and rhinorrhea present.  ?   Mouth/Throat:  ?   Mouth: Mucous membranes are moist.  ?   Pharynx: No oropharyngeal exudate or posterior oropharyngeal erythema.  ?Eyes:  ?   Extraocular Movements: Extraocular movements intact.  ?   Conjunctiva/sclera: Conjunctivae normal.  ?   Pupils: Pupils are equal, round, and reactive to light.  ?Cardiovascular:  ?   Rate and Rhythm: Regular rhythm. Tachycardia present.  ?   Pulses: Normal pulses.  ?   Heart sounds: Normal heart sounds.  ?Pulmonary:  ?   Effort: Pulmonary effort is normal.  ?   Breath sounds: Normal breath sounds.  ?Abdominal:  ?   General: Bowel sounds are normal.  ?   Palpations: Abdomen is soft.  ?   Tenderness: There is no abdominal tenderness.  ?Musculoskeletal:  ?   Cervical back: Normal range of motion. No tenderness.  ?Lymphadenopathy:  ?   Cervical: No cervical adenopathy.  ?Skin: ?   General: Skin is dry.  ?Neurological:  ?   Mental Status: She is alert and oriented to person, place, and time.  ?Psychiatric:     ?   Mood and Affect: Mood normal.     ?   Behavior: Behavior normal.  ? ? ? ?UC Treatments / Results  ?Labs ?(all labs ordered are listed, but only abnormal results are displayed) ?Labs Reviewed  ?SARS CORONAVIRUS 2 (TAT 6-24 HRS)  ? ? ?EKG ? ? ?Radiology ?No results found. ? ?Procedures ?Procedures (including critical care time) ? ?Medications Ordered in UC ?Medications - No data to display ? ?Initial Impression /  Assessment and Plan / UC Course  ?I have reviewed the triage vital  signs and the nursing notes. ? ?Pertinent labs & imaging results that were available during my care of the patient were reviewed by me and considered in my medical decision making (see chart for details). ? ?Patient presents with upper respiratory symptoms for 3 weeks.  She felt that she was getting better at one time, but over the last 3 days, symptoms have worsened.  She continues to have intermittent fever, nasal congestion and cough.  She has been taking over-the-counter medication with no relief.  We will go ahead and prescribe the patient azithromycin for 5 days for her symptoms.  We will also prescribe Bromfed cough syrup.  Patient was encouraged to continue ibuprofen or Tylenol as needed for pain, fever, or general discomfort.  Supportive care to include getting fluids and plenty of rest.  Patient follow-up if symptoms do not improve. ?Final Clinical Impressions(s) / UC Diagnoses  ? ?Final diagnoses:  ?Acute cough  ?Viral upper respiratory tract infection  ? ? ? ?Discharge Instructions   ? ?  ?Take medication as prescribed. ?Supportive care to include increasing rest and getting plenty of fluids. ?Make take over-the-counter Tylenol or ibuprofen as needed for pain, fever, or general discomfort.   ?Follow-up if symptoms do not improve. ? ? ? ? ?ED Prescriptions   ? ? Medication Sig Dispense Auth. Provider  ? azithromycin (ZITHROMAX) 250 MG tablet Take 1 tablet (250 mg total) by mouth daily for 5 days. Take first 2 tablets together, then 1 every day until finished. 6 tablet Marria Mathison-Warren, Sadie Haber, NP  ? brompheniramine-pseudoephedrine-DM 30-2-10 MG/5ML syrup Take 5 mLs by mouth 4 (four) times daily as needed for up to 7 days. 140 mL Kimika Streater-Warren, Sadie Haber, NP  ? ?  ? ?PDMP not reviewed this encounter. ?  ?Abran Cantor, NP ?09/10/21 1330 ? ?

## 2021-09-10 NOTE — Discharge Instructions (Addendum)
COVID test performed today. You will be contacted if your results are positive. ?Take medication as prescribed. ?Supportive care to include increasing rest and getting plenty of fluids. ?Make take over-the-counter Tylenol or ibuprofen as needed for pain, fever, or general discomfort.   ?Follow-up if symptoms do not improve. ?

## 2021-09-11 LAB — SARS CORONAVIRUS 2 (TAT 6-24 HRS): SARS Coronavirus 2: NEGATIVE

## 2021-09-29 DIAGNOSIS — Z419 Encounter for procedure for purposes other than remedying health state, unspecified: Secondary | ICD-10-CM | POA: Diagnosis not present

## 2021-10-17 ENCOUNTER — Other Ambulatory Visit: Payer: Self-pay

## 2021-10-17 ENCOUNTER — Emergency Department (HOSPITAL_COMMUNITY)
Admission: EM | Admit: 2021-10-17 | Discharge: 2021-10-17 | Disposition: A | Discharge status: Home / Self Care | Payer: Medicaid Other | Attending: Emergency Medicine | Admitting: Emergency Medicine

## 2021-10-17 ENCOUNTER — Encounter (HOSPITAL_COMMUNITY): Payer: Self-pay | Admitting: Emergency Medicine

## 2021-10-17 DIAGNOSIS — R519 Headache, unspecified: Secondary | ICD-10-CM | POA: Insufficient documentation

## 2021-10-17 DIAGNOSIS — R590 Localized enlarged lymph nodes: Secondary | ICD-10-CM | POA: Diagnosis not present

## 2021-10-17 DIAGNOSIS — R0989 Other specified symptoms and signs involving the circulatory and respiratory systems: Secondary | ICD-10-CM | POA: Diagnosis not present

## 2021-10-17 DIAGNOSIS — H9201 Otalgia, right ear: Secondary | ICD-10-CM | POA: Diagnosis present

## 2021-10-17 NOTE — ED Provider Notes (Signed)
?  [facility] Emergency Department ?Provider Note ?MRN:  [id number]  ?Arrival date & time: 10/17/21    ? ?Chief Complaint   ?Headache ?  ?History of Present Illness   ?Molly Hensley is a 26 y.o. year-old female presents to the ED with chief complaint of swollen lymph nodes behind right ear.  Reports associated runny nose, slight ear ache.  She denies fever.  Denies treatment PTA. ? ?History provided by patient. ? ? ?Review of Systems  ?Pertinent review of systems noted in HPI.  ? ? ?Physical Exam  ? ?Vitals:  ? 10/17/21 0621  ?BP: (!) 145/88  ?Pulse: 92  ?Resp: 15  ?Temp: 98.9 ?F (37.2 ?C)  ?SpO2: 99%  ?  ?CONSTITUTIONAL:  well-appearing, NAD ?NEURO:  Alert and oriented x 3, CN 3-12 grossly intact ?EYES:  eyes equal and reactive ?ENT/NECK:  Supple, no stridor, mild congestion behind both TMs, no erythema, mild oropharyngeal erythema ?CARDIO:  appears well-perfused  ?PULM:  No respiratory distress,  ?GI/GU:  non-distended,  ?MSK/SPINE:  No gross deformities, no edema, moves all extremities  ?SKIN:  no rash, atraumatic, posterior auricular lymphadenopathy and submandibular lymphadenopathy on the right ? ? ?*Additional and/or pertinent findings included in MDM below ? ?Diagnostic and Interventional Summary  ? ? EKG Interpretation ? ?Date/Time:    ?Ventricular Rate:    ?PR Interval:    ?QRS Duration:   ?QT Interval:    ?QTC Calculation:   ?R Axis:     ?Text Interpretation:   ?  ? ?  ? ?Labs Reviewed - No data to display  ?No orders to display  ?  ?Medications - No data to display  ? ?Procedures  /  Critical Care ?Procedures ? ?ED Course and Medical Decision Making  ?I have reviewed the triage vital signs, the nursing notes, and pertinent available records from the EMR. ? ?Complexity of Problems Addressed: ?Low Complexity: Acute, uncomplicated illness or injury requiring no diagnostic workup ?Comorbidities affecting this illness/injury include: ?None ?Social Determinants Affecting Care: ?No  clinically significant social determinants affecting this chief complaint.. ? ? ? ? ?Treatment and Plan: ?Symptoms seem consistent with reactive lymphadenopathy, likely viral.  Recommend warm compresses.  Return precautions discussed. ? ?Emergency department workup does not suggest an emergent condition requiring admission or immediate intervention beyond  what has been performed at this time. The patient is safe for discharge and has  been instructed to return immediately for worsening symptoms, change in  symptoms or any other concerns ? ? ? ?Final Clinical Impressions(s) / ED Diagnoses  ? ?  ICD-10-CM   ?1. Posterior auricular lymphadenopathy  R59.0   ?  ?  ?ED Discharge Orders   ? ? None  ? ?  ?  ? ? ?Discharge Instructions Discussed with and Provided to Patient:  ? ? ?Discharge Instructions   ? ?  ?Take Zyrtec or Claritin.  ? ?Apply warm compresses to the lymph nodes. ? ? ? ?  ?Roxy Horseman, PA-C ?10/17/21 4098 ? ?  ?Dione Booze, MD ?10/17/21 [phone] ? ?

## 2021-10-17 NOTE — ED Triage Notes (Signed)
Pt c/o headache and knots on the back of her head x 3 days.  ?

## 2021-10-17 NOTE — Discharge Instructions (Signed)
Take Zyrtec or Claritin.  ? ?Apply warm compresses to the lymph nodes. ?

## 2021-10-29 DIAGNOSIS — Z419 Encounter for procedure for purposes other than remedying health state, unspecified: Secondary | ICD-10-CM | POA: Diagnosis not present

## 2021-11-16 DIAGNOSIS — Z114 Encounter for screening for human immunodeficiency virus [HIV]: Secondary | ICD-10-CM | POA: Diagnosis not present

## 2021-11-16 DIAGNOSIS — N760 Acute vaginitis: Secondary | ICD-10-CM | POA: Diagnosis not present

## 2021-11-16 DIAGNOSIS — Z113 Encounter for screening for infections with a predominantly sexual mode of transmission: Secondary | ICD-10-CM | POA: Diagnosis not present

## 2021-11-29 DIAGNOSIS — Z419 Encounter for procedure for purposes other than remedying health state, unspecified: Secondary | ICD-10-CM | POA: Diagnosis not present

## 2021-12-29 DIAGNOSIS — Z419 Encounter for procedure for purposes other than remedying health state, unspecified: Secondary | ICD-10-CM | POA: Diagnosis not present

## 2022-01-07 ENCOUNTER — Inpatient Hospital Stay (HOSPITAL_COMMUNITY)
Admission: AD | Admit: 2022-01-07 | Discharge: 2022-01-07 | Disposition: A | Discharge status: Home / Self Care | Payer: Medicaid Other | Attending: Family Medicine | Admitting: Family Medicine

## 2022-01-07 ENCOUNTER — Inpatient Hospital Stay (HOSPITAL_COMMUNITY): Payer: Medicaid Other

## 2022-01-07 ENCOUNTER — Other Ambulatory Visit: Payer: Self-pay

## 2022-01-07 ENCOUNTER — Encounter (HOSPITAL_COMMUNITY): Payer: Self-pay | Admitting: *Deleted

## 2022-01-07 DIAGNOSIS — O26891 Other specified pregnancy related conditions, first trimester: Secondary | ICD-10-CM | POA: Diagnosis not present

## 2022-01-07 DIAGNOSIS — R102 Pelvic and perineal pain: Secondary | ICD-10-CM

## 2022-01-07 DIAGNOSIS — Z3A01 Less than 8 weeks gestation of pregnancy: Secondary | ICD-10-CM | POA: Diagnosis not present

## 2022-01-07 DIAGNOSIS — Z3A10 10 weeks gestation of pregnancy: Secondary | ICD-10-CM | POA: Insufficient documentation

## 2022-01-07 DIAGNOSIS — R109 Unspecified abdominal pain: Secondary | ICD-10-CM | POA: Diagnosis not present

## 2022-01-07 LAB — WET PREP, GENITAL
Clue Cells Wet Prep HPF POC: NONE SEEN
Sperm: NONE SEEN
Trich, Wet Prep: NONE SEEN
WBC, Wet Prep HPF POC: <10
Yeast Wet Prep HPF POC: NONE SEEN

## 2022-01-07 LAB — CBC
HCT: 34.3 % — ABNORMAL LOW (ref 36.0–46.0)
Hemoglobin: 11.4 g/dL — ABNORMAL LOW (ref 12.0–15.0)
MCH: 30.8 pg (ref 26.0–34.0)
MCHC: 33.2 g/dL (ref 30.0–36.0)
MCV: 92.7 fL (ref 80.0–100.0)
Platelets: 259 K/uL (ref 150–400)
RBC: 3.7 MIL/uL — ABNORMAL LOW (ref 3.87–5.11)
RDW: 13.5 % (ref 11.5–15.5)
WBC: 7.6 K/uL (ref 4.0–10.5)
nRBC: 0 % (ref 0.0–0.2)

## 2022-01-07 LAB — URINALYSIS, ROUTINE W REFLEX MICROSCOPIC
Bilirubin Urine: NEGATIVE
Glucose, UA: NEGATIVE mg/dL
Hgb urine dipstick: NEGATIVE
Ketones, ur: NEGATIVE mg/dL
Leukocytes,Ua: NEGATIVE
Nitrite: NEGATIVE
Protein, ur: NEGATIVE mg/dL
Specific Gravity, Urine: 1.016 (ref 1.005–1.030)
pH: 6 (ref 5.0–8.0)

## 2022-01-07 LAB — POCT PREGNANCY, URINE: Preg Test, Ur: POSITIVE — AB

## 2022-01-07 LAB — HCG, QUANTITATIVE, PREGNANCY: hCG, Beta Chain, Quant, S: 19777 m[IU]/mL — ABNORMAL HIGH

## 2022-01-07 LAB — HIV ANTIBODY (ROUTINE TESTING W REFLEX): HIV Screen 4th Generation wRfx: NONREACTIVE

## 2022-01-07 MED ORDER — ACETAMINOPHEN 325 MG PO TABS
650.0000 mg | ORAL_TABLET | ORAL | Status: DC | PRN
  Administered 2022-01-07: 650 mg via ORAL
  Filled 2022-01-07: qty 2

## 2022-01-07 NOTE — Discharge Instructions (Signed)
[facility]   [facility]             [address], [place], [place] [address] [phone]  [facility]                                                             [address], [address], [place], [place], [address] [phone]  [facility]                                    [address], [address], [place], [place], [address] [phone]  [facility] [place] [address], [address], [place], [place], [address] [phone]  [facility]                                 [address], [place], [place], [address] [phone]  [facility]                                    [address], [place], [place], [address] [phone]  [facility] [address], [address], [place], [place], [address]                              [facility] [address], [address], [place], [place], [address] [phone]  [facility]         Phone: [phone]  [facility]      Phone: [phone]   [facility]      Phone: [phone]  [facility]         Phone: [phone]   [facility]    Phone: [phone]  [facility]      Phone: [phone]  [facility]     Phone: [phone]  [facility]        Phone: [phone]  [facility]      Phone: [phone]  

## 2022-01-07 NOTE — MAU Note (Addendum)
Molly Hensley is a 26 y.o. at Unknown here in MAU reporting: began having abdominal cramping last night.  Also states has increase in vaginal discharge, reports discharge is creamy, white but doesn't have any odor.  Had +HPT.  Denies VB. LMP: 10/24/2021 Onset of complaint: last night Pain score: 8/10 Vitals:   01/07/22 0957  BP: 115/72  Pulse: (!) 102  Temp: 99.2 F (37.3 C)  SpO2: 100%     FHT:N/A Lab orders placed from triage:   UPT & UA

## 2022-01-07 NOTE — MAU Provider Note (Addendum)
History     CSN: [id number]  Arrival date and time: 01/07/22 2703   None     Chief Complaint  Patient presents with   Cramping   Vaginal Discharge   26 y/o F G3P1011 [redacted]w[redacted]d dated by LMP, presents to the MAU with cramping and increased vaginal discharge starting last night. Cramping is severe, constant, pain 7/10. She has not taking any medication for pain. She denies vaginal bleeding, new sexual partners, increased urinary pain, hematuria. She has increased urinary frequency. Pregnancy was not planned, no prenatal vitamins, and she is scheduling her first OB appointment soon.   Pelvic Pain The patient's primary symptoms include vaginal discharge. This is a new problem. The current episode started yesterday. The problem occurs constantly. The problem has been unchanged. The problem affects the left (midline to left side) side. She is pregnant. Associated symptoms include frequency. Pertinent negatives include no chills, dysuria, fever or hematuria. The vaginal discharge was milky and white. There has been no bleeding. She has not been passing clots. She has not been passing tissue. She has tried nothing for the symptoms. Her menstrual history has been regular.    Past Medical History:  Diagnosis Date   Asthma    Childhood   Chlamydia    Headache     Past Surgical History:  Procedure Laterality Date   INDUCED ABORTION     TOOTH EXTRACTION      Family History  Problem Relation Age of Onset   Hypertension Mother    Epilepsy Mother    Heart disease Mother    Cancer Maternal Grandmother     Social History   Tobacco Use   Smoking status: Every Day    Packs/day: 0.25    Types: Cigarettes   Smokeless tobacco: Never   Tobacco comments:    cutting down  Vaping Use   Vaping Use: Never used  Substance Use Topics   Alcohol use: No    Alcohol/week: 0.0 standard drinks of alcohol   Drug use: Not Currently    Types: Marijuana    Comment: not since first trimester     Allergies: No Known Allergies  No medications prior to admission.    Review of Systems  Constitutional:  Negative for activity change, appetite change, chills and fever.  Genitourinary:  Positive for frequency and vaginal discharge. Negative for difficulty urinating, dysuria, hematuria and vaginal bleeding.   Physical Exam   Blood pressure 115/72, pulse (!) 102, temperature 99.2 F (37.3 C), temperature source Oral, resp. rate 18, height 5\' 3"  (1.6 m), weight 90 kg, last menstrual period 10/24/2021, SpO2 100 %, not currently breastfeeding.  Physical Exam Exam conducted with a chaperone present.  Constitutional:      General: She is not in acute distress.    Appearance: Normal appearance. She is normal weight. She is not ill-appearing.  Cardiovascular:     Rate and Rhythm: Normal rate and regular rhythm.     Pulses: Normal pulses.     Heart sounds: Normal heart sounds.  Abdominal:     General: Bowel sounds are normal.     Palpations: Abdomen is soft.  Genitourinary:    General: Normal vulva.     Pubic Area: No rash.      Labia:        Right: No rash or lesion.        Left: No rash or lesion.      Vagina: No vaginal discharge.  Neurological:     Mental Status:  She is alert.    Results for orders placed or performed during the hospital encounter of 01/07/22 (from the past 24 hour(s))  Pregnancy, urine POC     Status: Abnormal   Collection Time: 01/07/22  9:49 AM  Result Value Ref Range   Preg Test, Ur POSITIVE (A) NEGATIVE  Urinalysis, Routine w reflex microscopic Urine, Clean Catch     Status: None   Collection Time: 01/07/22 10:26 AM  Result Value Ref Range   Color, Urine YELLOW YELLOW   APPearance CLEAR CLEAR   Specific Gravity, Urine 1.016 1.005 - 1.030   pH 6.0 5.0 - 8.0   Glucose, UA NEGATIVE NEGATIVE mg/dL   Hgb urine dipstick NEGATIVE NEGATIVE   Bilirubin Urine NEGATIVE NEGATIVE   Ketones, ur NEGATIVE NEGATIVE mg/dL   Protein, ur NEGATIVE NEGATIVE mg/dL    Nitrite NEGATIVE NEGATIVE   Leukocytes,Ua NEGATIVE NEGATIVE  Wet prep, genital     Status: None   Collection Time: 01/07/22 10:31 AM   Specimen: Vaginal  Result Value Ref Range   Yeast Wet Prep HPF POC NONE SEEN NONE SEEN   Trich, Wet Prep NONE SEEN NONE SEEN   Clue Cells Wet Prep HPF POC NONE SEEN NONE SEEN   WBC, Wet Prep HPF POC <10 <10   Sperm NONE SEEN   hCG, quantitative, pregnancy     Status: Abnormal   Collection Time: 01/07/22 10:54 AM  Result Value Ref Range   hCG, Beta Chain, Quant, S 19,777 (H) <5 mIU/mL  CBC     Status: Abnormal   Collection Time: 01/07/22 10:54 AM  Result Value Ref Range   WBC 7.6 4.0 - 10.5 K/uL   RBC 3.70 (L) 3.87 - 5.11 MIL/uL   Hemoglobin 11.4 (L) 12.0 - 15.0 g/dL   HCT 40.9 (L) 81.1 - 91.4 %   MCV 92.7 80.0 - 100.0 fL   MCH 30.8 26.0 - 34.0 pg   MCHC 33.2 30.0 - 36.0 g/dL   RDW 78.2 95.6 - 21.3 %   Platelets 259 150 - 400 K/uL   nRBC 0.0 0.0 - 0.2 %  HIV Antibody (routine testing w rflx)     Status: None   Collection Time: 01/07/22 10:54 AM  Result Value Ref Range   HIV Screen 4th Generation wRfx Non Reactive Non Reactive   US OB LESS THAN 14 WEEKS WITH OB TRANSVAGINAL  Result Date: 01/07/2022 CLINICAL DATA:  Cramping EXAM: OBSTETRIC <14 WK [place] AND TRANSVAGINAL OB US TECHNIQUE: Both transabdominal and transvaginal ultrasound examinations were performed for complete evaluation of the gestation as well as the maternal uterus, adnexal regions, and pelvic cul-de-sac. Transvaginal technique was performed to assess early pregnancy. COMPARISON:  Pelvic ultrasound 01/18/2021 FINDINGS: Intrauterine gestational sac: Single Yolk sac:  Visualized Embryo:  Visualized Cardiac Activity: Visualized Heart Rate: 83 bpm CRL:  3.4 mm   6 w   0 d                  [place] EDC: 09/02/2022 Subchorionic hemorrhage:  None visualized. Maternal uterus/adnexae: Normal ovaries with corpus luteum in the right ovary. Trace simple free fluid in the pelvis, likely physiologic.  IMPRESSION: Single intrauterine pregnancy. Low fetal heart rate of 83 beats per minute. Estimated gestational age [redacted] weeks 0 days. Electronically Signed   By: Caprice Renshaw M.D.   On: 01/07/2022 12:26     Transabdominal U/S: uterus present, gestational sac located. Fetal pole not visualized. Suspect an earlier pregnancy date. Will need transvaginal for  further evaluation.   Reviewed Transvaginal U/S: confirmed IUP with fetal pole, measuring [redacted]w[redacted]d. Ovaries present with good blood flow. Fetal HR measuring 83 bpm.   MAU Course  Procedures  MDM 26 y/o F G3P1011 [redacted]w[redacted]d dated by LMP presents to the MAU with cramping and increased vaginal discharge starting last night. She denies vaginal bleeding, new sexual partners, increased urinary pain, hematuria. She has increased urinary frequency. Cramping pain, will obtain OB U/S, wet prep, STD blood work. Transvaginal U/S showed an IUP dating ~[redacted]w[redacted]d GS with HR 83, ovaries normal. Wet prep was negative, UA negative, CBC WNL. Tylenol and a heating pad helped relieve her pain.  Assessment and Plan   1. [redacted] weeks gestation of pregnancy   2. Pelvic pain affecting pregnancy in first trimester, antepartum    Patient to d/c home, follow up outpatient with your OB.  For pelvic pain, continue using tylenol and heating pads.   Return precautions include vaginal bleeding with cramping, increased pain, nausea/vomiting, fever >100.4.   Glendale Chard 01/07/2022, 10:05 AM     Attestation of Attending Supervision of Resident: Evaluation and management procedures were performed by the Resident under my supervision and collaboration.  I have seen and examined the patient.  I agree with the Resident's note and Assessment and Plan.  Patient seen in conjunction with Dr. Hyacinth Meeker, PGY1.  Images of ultrasound reviewed.  Ultrasound shows intrauterine pregnancy measuring 6 weeks 0 days with a heart rate of 83.  Agree with her above note and assessment/plan. Follow-up as  indicated.  Candelaria Celeste, DO Attending Physician Faculty [facility], [facility] [place]

## 2022-01-08 LAB — GC/CHLAMYDIA PROBE AMP (~~LOC~~) NOT AT ARMC
Chlamydia: NEGATIVE
Comment: NEGATIVE
Comment: NORMAL
Neisseria Gonorrhea: NEGATIVE

## 2022-01-08 LAB — RPR: RPR Ser Ql: NONREACTIVE

## 2022-01-29 ENCOUNTER — Telehealth (INDEPENDENT_AMBULATORY_CARE_PROVIDER_SITE_OTHER): Payer: Medicaid Other

## 2022-01-29 DIAGNOSIS — Z419 Encounter for procedure for purposes other than remedying health state, unspecified: Secondary | ICD-10-CM | POA: Diagnosis not present

## 2022-01-29 DIAGNOSIS — Z3480 Encounter for supervision of other normal pregnancy, unspecified trimester: Secondary | ICD-10-CM | POA: Insufficient documentation

## 2022-01-29 DIAGNOSIS — Z3A00 Weeks of gestation of pregnancy not specified: Secondary | ICD-10-CM

## 2022-01-29 MED ORDER — BLOOD PRESSURE MONITORING DEVI: 1.0000 | 0 refills | Status: DC

## 2022-01-29 MED ORDER — GOJJI WEIGHT SCALE MISC: 1.0000 | 0 refills | Status: DC

## 2022-01-29 NOTE — Patient Instructions (Signed)

## 2022-01-29 NOTE — Progress Notes (Signed)
New OB Intake  I connected with  Molly Hensley on 01/29/22 at  8:15 AM EDT by MyChart Video Visit and verified that I am speaking with the correct person using two identifiers. Nurse is located at [facility] and pt is located at [facility].  I discussed the limitations, risks, security and privacy concerns of performing an evaluation and management service by telephone and the availability of in person appointments. I also discussed with the patient that there may be a patient responsible charge related to this service. The patient expressed understanding and agreed to proceed.  I explained I am completing New OB Intake today. We discussed her EDD of 09/01/21 that is based on U/S on 01/07/22. Pt is G3/P1. I reviewed her allergies, medications, Medical/Surgical/OB history, and appropriate screenings. I informed her of [facility] services. [facility] information placed in AVS. Based on history, this is a/an  pregnancy uncomplicated .   Patient Active Problem List   Diagnosis Date Noted   Visit for routine gyn exam 01/31/2021   Contraception management 01/31/2021   Acute on chronic anemia 07/21/2020   Screening for lipid disorders 03/17/2015   Tobacco use disorder 03/08/2014    Concerns addressed today  Delivery Plans Plans to deliver at [facility]. Patient given information for [facility] Healthy Baby website for more information about [facility]. Patient is not interested in water birth. Offered upcoming OB visit with CNM to discuss further.  MyChart/Babyscripts MyChart access verified. I explained pt will have some visits in office and some virtually. Babyscripts instructions given and order placed. Patient verifies receipt of registration text/e-mail. Account successfully created and app downloaded.  Blood Pressure Cuff/Weight Scale Blood pressure cuff ordered for patient to pick-up from [employer]. Explained after first prenatal appt pt will check weekly and document in Babyscripts. Patient  does / does not  have weight scale. Weight scale ordered for patient to pick up from [employer].   Anatomy US Explained first scheduled [place] will be around 19 weeks. Anatomy US scheduled for 04/08/22 at 0945. Pt notified to arrive at 0930.  Labs Discussed Avelina Laine genetic screening with patient. Would like both Panorama and Horizon drawn at new OB visit. Routine prenatal labs needed.  Covid Vaccine Patient has covid vaccine.   Is patient a CenteringPregnancy candidate?  Accepted Declined due to NA Not a candidate due to NA Centering Patient" indicated on sticky note   Is patient a Mom+Baby Combined Care candidate?  Not a candidate    Scheduled with Mom+Baby provider   Social Determinants of Health Food Insecurity: Patient denies food insecurity. WIC Referral: Patient is interested in referral to [facility].  Transportation: Patient denies transportation needs. Childcare: Discussed no children allowed at ultrasound appointments. Offered childcare services; patient declines childcare services at this time.  First visit review I reviewed new OB appt with pt. I explained she will have a provider visit that includes  . Explained pt will be seen by Corlis Hove, NP at first visit; encounter routed to appropriate provider. Explained that patient will be seen by pregnancy navigator following visit with provider.   Henrietta Dine, CMA 01/29/2022  8:32 AM

## 2022-02-01 DIAGNOSIS — Z114 Encounter for screening for human immunodeficiency virus [HIV]: Secondary | ICD-10-CM | POA: Diagnosis not present

## 2022-02-01 DIAGNOSIS — Z113 Encounter for screening for infections with a predominantly sexual mode of transmission: Secondary | ICD-10-CM | POA: Diagnosis not present

## 2022-02-01 DIAGNOSIS — N760 Acute vaginitis: Secondary | ICD-10-CM | POA: Diagnosis not present

## 2022-02-09 ENCOUNTER — Inpatient Hospital Stay (HOSPITAL_COMMUNITY)
Admission: AD | Admit: 2022-02-09 | Discharge: 2022-02-09 | Disposition: A | Discharge status: Home / Self Care | Payer: Medicaid Other | Attending: Obstetrics and Gynecology | Admitting: Obstetrics and Gynecology

## 2022-02-09 ENCOUNTER — Other Ambulatory Visit: Payer: Self-pay | Admitting: Obstetrics and Gynecology

## 2022-02-09 ENCOUNTER — Inpatient Hospital Stay (HOSPITAL_COMMUNITY): Payer: Medicaid Other

## 2022-02-09 ENCOUNTER — Other Ambulatory Visit: Payer: Self-pay

## 2022-02-09 ENCOUNTER — Encounter (HOSPITAL_COMMUNITY): Payer: Self-pay | Admitting: Obstetrics and Gynecology

## 2022-02-09 DIAGNOSIS — Z3A10 10 weeks gestation of pregnancy: Secondary | ICD-10-CM | POA: Diagnosis present

## 2022-02-09 DIAGNOSIS — O209 Hemorrhage in early pregnancy, unspecified: Secondary | ICD-10-CM | POA: Diagnosis not present

## 2022-02-09 DIAGNOSIS — O021 Missed abortion: Secondary | ICD-10-CM | POA: Diagnosis not present

## 2022-02-09 DIAGNOSIS — O99321 Drug use complicating pregnancy, first trimester: Secondary | ICD-10-CM | POA: Diagnosis present

## 2022-02-09 DIAGNOSIS — Z3480 Encounter for supervision of other normal pregnancy, unspecified trimester: Secondary | ICD-10-CM

## 2022-02-09 DIAGNOSIS — Z3A01 Less than 8 weeks gestation of pregnancy: Secondary | ICD-10-CM | POA: Diagnosis not present

## 2022-02-09 DIAGNOSIS — O26891 Other specified pregnancy related conditions, first trimester: Secondary | ICD-10-CM | POA: Diagnosis not present

## 2022-02-09 DIAGNOSIS — O99511 Diseases of the respiratory system complicating pregnancy, first trimester: Secondary | ICD-10-CM | POA: Diagnosis present

## 2022-02-09 NOTE — MAU Note (Signed)
Molly Hensley is a 26 y.o. at [redacted]w[redacted]d here in MAU reporting: she began spotting on Thursday, but began having heavier VB today. Denies recent intercourse States she's also been having abdominal cramping.  Currently taking antibiotics for treatment of BV. LMP: N/A Onset of complaint: today Pain score: 8/10 Vitals:   02/09/22 1734  BP: 114/81  Pulse: 85  Resp: 18  Temp: 98 F (36.7 C)  SpO2: 98%     WUJ:[id number], no FHT heard Lab orders placed from triage:   UA

## 2022-02-09 NOTE — Progress Notes (Addendum)
History     [id number]  Arrival date and time: 02/09/22 1633    Chief Complaint  Patient presents with   Vaginal Bleeding     HPI Molly Hensley is a 26 y.o. at [redacted]w[redacted]d by LMP confirmed by 6 weeks [place] with PMHx notable for tobacco abuse, who presents for vaginal bleeding. It started on Tuesday with spotting and then it picked up today with wiping. She does not have any cramping.    Review of records from Care Everywhere: No ultrasounds or labs from OSH.   Vaginal bleeding: Yes     OB History     Gravida  3   Para  1   Term  1   Preterm      AB  1   Living  1      SAB      IAB  1   Ectopic      Multiple  0   Live Births  1           Past Medical History:  Diagnosis Date   Asthma    Childhood   Chlamydia    Headache     Past Surgical History:  Procedure Laterality Date   INDUCED ABORTION     TOOTH EXTRACTION      Family History  Problem Relation Age of Onset   Hypertension Mother    Epilepsy Mother    Heart disease Mother    Cancer Maternal Grandmother     Social History   Socioeconomic History   Marital status: Single    Spouse name: Not on file   Number of children: Not on file   Years of education: Not on file   Highest education level: Not on file  Occupational History   Not on file  Tobacco Use   Smoking status: Former    Packs/day: 0.25    Types: Cigarettes   Smokeless tobacco: Never   Tobacco comments:    cutting down  Vaping Use   Vaping Use: Never used  Substance and Sexual Activity   Alcohol use: No    Alcohol/week: 0.0 standard drinks of alcohol   Drug use: Not Currently    Types: Marijuana    Comment: Last smoked August 2023   Sexual activity: Yes    Birth control/protection: Diaphragm    Comment: Paediatric nurse  Other Topics Concern   Not on file  Social History Narrative   Not on file   Social Determinants of Health   Financial Resource Strain: Not on file  Food Insecurity: Not on file   Transportation Needs: Not on file  Physical Activity: Not on file  Stress: Not on file  Social Connections: Not on file  Intimate Partner Violence: Not on file    No Known Allergies  No current facility-administered medications on file prior to encounter.   Current Outpatient Medications on File Prior to Encounter  Medication Sig Dispense Refill   metroNIDAZOLE (FLAGYL) 500 MG tablet Take 500 mg by mouth 2 (two) times daily.     Prenatal Vit-Fe Fumarate-FA (MULTIVITAMIN-PRENATAL) 27-0.8 MG TABS tablet Take 1 tablet by mouth daily at 12 noon.     Blood Pressure Monitoring DEVI 1 each by Does not apply route once a week. 1 each 0   Misc. Devices (GOJJI WEIGHT SCALE) MISC 1 each by Does not apply route once a week. 1 each 0     Review of Systems  Constitutional: Negative.   HENT: Negative.  Eyes: Negative.   Respiratory: Negative.    Cardiovascular: Negative.   Gastrointestinal: Negative.   Genitourinary:  Negative for dysuria, flank pain, frequency, hematuria and urgency.       Vaginal bleeding  Musculoskeletal: Negative.   Skin: Negative.   Neurological: Negative.   Endo/Heme/Allergies: Negative.   Psychiatric/Behavioral: Negative.     Pertinent positives and negative per HPI, all others reviewed and negative  Physical Exam   BP (!) 125/92 (BP Location: Right Arm)   Pulse 94   Temp 98 F (36.7 C) (Oral)   Resp 18   Ht 5\' 3"  (1.6 m)   Wt 93.3 kg   LMP 10/24/2021 (Exact Date)   SpO2 98%   BMI 36.44 kg/m   Patient Vitals for the past 24 hrs:  BP Temp Temp src Pulse Resp SpO2 Height Weight  02/09/22 1754 (!) 125/92 -- -- 94 -- -- -- --  02/09/22 1734 114/81 98 F (36.7 C) Oral 85 18 98 % -- --  02/09/22 1729 -- -- -- -- -- -- 5\' 3"  (1.6 m) 93.3 kg    Physical Exam Constitutional:      Appearance: Normal appearance. She is normal weight.  HENT:     Head: Normocephalic and atraumatic.     Mouth/Throat:     Mouth: Mucous membranes are moist.     Pharynx:  Oropharynx is clear.  Eyes:     Extraocular Movements: Extraocular movements intact.     Conjunctiva/sclera: Conjunctivae normal.     Pupils: Pupils are equal, round, and reactive to light.  Cardiovascular:     Rate and Rhythm: Normal rate.  Pulmonary:     Effort: Pulmonary effort is normal.  Abdominal:     General: Abdomen is flat.     Palpations: Abdomen is soft.  Musculoskeletal:     Cervical back: Normal range of motion.  Skin:    General: Skin is warm.     Capillary Refill: Capillary refill takes less than 2 seconds.  Neurological:     General: No focal deficit present.     Mental Status: She is alert and oriented to person, place, and time. Mental status is at baseline.  Psychiatric:        Mood and Affect: Mood normal.        Behavior: Behavior normal.        Thought Content: Thought content normal.        Judgment: Judgment normal.      Bedside Ultrasound Performed 04/11/22 and gestational sac visualized. No visible CRL. No yolk sac visible but bladder was empty at the time of my scan. Informed pt of concern for miscarriage and that we would get formal TVUS.   Labs No results found for this or any previous visit (from the past 24 hour(s)).  Imaging OB Transvaginal  Result Date: 02/09/2022 CLINICAL DATA:  Bleeding. EXAM: TRANSVAGINAL OB ULTRASOUND TECHNIQUE: Transvaginal ultrasound was performed for complete evaluation of the gestation as well as the maternal uterus, adnexal regions, and pelvic cul-de-sac. COMPARISON:  Obstetrical ultrasound 01/07/2022 FINDINGS: Intrauterine gestational sac: There is a single irregularly-shaped anechoic sac-like structure containing 1 thin membrane. Yolk sac:  Not Visualized. Embryo:  Not Visualized. MSD: 19.8 mm   6 w   6 d Subchorionic hemorrhage:  None visualized. Maternal uterus/adnexae: The bilateral ovaries are visualized and appear within normal limits. No free fluid. IMPRESSION: 1. Questionable single irregularly shaped gestational  sac. No yolk sac or embryo identified. Findings are concerning for failed  IUP with retained products of conception when correlating with prior scan 01/07/2022. Differential diagnosis would include pseudo gestational sac with occult ectopic pregnancy, although this is less likely. Electronically Signed   By: Darliss Cheney M.D.   On: 02/09/2022 19:39    MAU Course  Procedures Lab Orders  No laboratory test(s) ordered today   No orders of the defined types were placed in this encounter.  Imaging Orders         US OB Transvaginal     MDM moderate  Assessment and Plan  Missed abortion - Discussed options: expectant management, mife/misoprostol and D&E. Discussed the risks and benefits to each.   - We discussed dosing and process of mife/miso: Discussed normal bleeding and cramping with the medication. Discussed pain medication often times needed when medically induced for missed abortion. Discussed success rate is depending on gestational age at the time of miscarriage  - Risks of surgery include but are not limited to: bleeding, infection, injury to surrounding organs/tissues (i.e. bowel/bladder/ureters), need for additional procedures, wound complications, hospital re-admission, and conversion to open surgery - We discussed postop restrictions, precautions and expectations. We discussed typical hospital course and stay.  - She is RH positive (A+) - She would like: Surgical management - message sent to [facility] for scheduling. Message sent to office to correct her upcoming appts and ultrasounds. Also placed preop orders.    Milas Hock, MD 02/09/22 7:53 PM  Allergies as of 02/09/2022   No Known Allergies      Medication List     STOP taking these medications    Blood Pressure Monitoring Devi   Gojji Weight Scale Misc   metroNIDAZOLE 500 MG tablet Commonly known as: FLAGYL       TAKE these medications    multivitamin-prenatal 27-0.8 MG Tabs tablet Take 1 tablet by mouth  daily at 12 noon.

## 2022-02-09 NOTE — Progress Notes (Signed)
Orders placed for surgery preop.

## 2022-02-11 ENCOUNTER — Ambulatory Visit (HOSPITAL_COMMUNITY)
Admission: EM | Admit: 2022-02-11 | Discharge: 2022-02-11 | Disposition: A | Discharge status: Home / Self Care | Payer: Medicaid Other | Attending: Emergency Medicine | Admitting: Emergency Medicine

## 2022-02-11 ENCOUNTER — Encounter (HOSPITAL_COMMUNITY): Payer: Self-pay | Admitting: Emergency Medicine

## 2022-02-11 ENCOUNTER — Telehealth: Payer: Self-pay

## 2022-02-11 ENCOUNTER — Encounter (HOSPITAL_COMMUNITY): Payer: Self-pay | Admitting: Obstetrics & Gynecology

## 2022-02-11 ENCOUNTER — Other Ambulatory Visit: Payer: Self-pay

## 2022-02-11 DIAGNOSIS — J069 Acute upper respiratory infection, unspecified: Secondary | ICD-10-CM | POA: Diagnosis not present

## 2022-02-11 DIAGNOSIS — Z20822 Contact with and (suspected) exposure to covid-19: Secondary | ICD-10-CM | POA: Diagnosis not present

## 2022-02-11 LAB — POCT RAPID STREP A, ED / UC: Streptococcus, Group A Screen (Direct): NEGATIVE

## 2022-02-11 LAB — POC INFLUENZA A AND B ANTIGEN (URGENT CARE ONLY)
INFLUENZA A ANTIGEN, POC: NEGATIVE
INFLUENZA B ANTIGEN, POC: NEGATIVE

## 2022-02-11 MED ORDER — DOXYCYCLINE HYCLATE 100 MG IV SOLR
200.0000 mg | INTRAVENOUS | Status: AC
  Administered 2022-02-12: 200 mg via INTRAVENOUS
  Filled 2022-02-11 (×2): qty 200

## 2022-02-11 NOTE — Telephone Encounter (Signed)
Called patient with surgery date, time, location and preop instructions. Patient expressed understanding

## 2022-02-11 NOTE — ED Provider Notes (Signed)
[facility]    CSN: [id number] Arrival date & time: 02/11/22  0935      History   Chief Complaint Chief Complaint  Patient presents with   Chills   Cough   Nasal Congestion    HPI Molly Hensley is a 26 y.o. female.   Patient presents with chills, subjective fever, nasal congestion, rhinorrhea, scant scratchy sore throat and nonproductive cough beginning 1 day ago.  No known sick contacts.  Decreased appetite but tolerating fluids.  Has not attempted treatment of symptoms.  History of childhood asthma.  Denies shortness of breath, wheezing, ear pain.  Past Medical History:  Diagnosis Date   Asthma    Childhood   Chlamydia    Headache     Patient Active Problem List   Diagnosis Date Noted   Supervision of other normal pregnancy, antepartum 01/29/2022   Acute on chronic anemia 07/21/2020   Tobacco use disorder 03/08/2014    Past Surgical History:  Procedure Laterality Date   INDUCED ABORTION     TOOTH EXTRACTION      OB History     Gravida  3   Para  1   Term  1   Preterm      AB  1   Living  1      SAB      IAB  1   Ectopic      Multiple  0   Live Births  1            Home Medications    Prior to Admission medications   Medication Sig Start Date End Date Taking? Authorizing Provider  metroNIDAZOLE (FLAGYL) 500 MG tablet Take 500 mg by mouth in the morning and at bedtime.    [provider]  Prenatal Vit-Fe Fumarate-FA (MULTIVITAMIN-PRENATAL) 27-0.8 MG TABS tablet Take 1 tablet by mouth daily with breakfast.    [provider]    Family History Family History  Problem Relation Age of Onset   Hypertension Mother    Epilepsy Mother    Heart disease Mother    Cancer Maternal Grandmother     Social History Social History   Tobacco Use   Smoking status: Former    Packs/day: 0.25    Types: Cigarettes   Smokeless tobacco: Never   Tobacco comments:    cutting down  Vaping Use   Vaping Use: Never  used  Substance Use Topics   Alcohol use: No    Alcohol/week: 0.0 standard drinks of alcohol   Drug use: Not Currently    Types: Marijuana    Comment: Last smoked August 2023     Allergies   Patient has no known allergies.   Review of Systems Review of Systems  Constitutional:  Positive for chills and fever. Negative for activity change, appetite change, diaphoresis, fatigue and unexpected weight change.  HENT:  Positive for congestion, rhinorrhea and sore throat. Negative for dental problem, drooling, ear discharge, ear pain, facial swelling, hearing loss, mouth sores, nosebleeds, postnasal drip, sinus pressure, sinus pain, sneezing, tinnitus, trouble swallowing and voice change.   Respiratory:  Positive for cough. Negative for apnea, choking, chest tightness, shortness of breath, wheezing and stridor.   Cardiovascular: Negative.   Gastrointestinal: Negative.   Skin: Negative.   Neurological: Negative.      Physical Exam Triage Vital Signs ED Triage Vitals  Enc Vitals Group     BP 02/11/22 1043 118/82     Pulse Rate 02/11/22 1043 88  Resp 02/11/22 1043 17     Temp 02/11/22 1043 99.2 F (37.3 C)     Temp Source 02/11/22 1043 Oral     SpO2 02/11/22 1041 98 %     Weight --      Height --      Head Circumference --      Peak Flow --      Pain Score 02/11/22 1041 8     Pain Loc --      Pain Edu? --      Excl. in GC? --    No data found.  Updated Vital Signs BP 118/82 (BP Location: Right Arm)   Pulse 88   Temp 99.2 F (37.3 C) (Oral)   Resp 17   LMP 10/24/2021 (Exact Date)   SpO2 98%   Breastfeeding Unknown   Visual Acuity Right Eye Distance:   Left Eye Distance:   Bilateral Distance:    Right Eye Near:   Left Eye Near:    Bilateral Near:     Physical Exam Constitutional:      Appearance: Normal appearance.  HENT:     Head: Normocephalic.     Right Ear: Tympanic membrane, ear canal and external ear normal.     Left Ear: Tympanic membrane, ear  canal and external ear normal.     Nose: Congestion and rhinorrhea present.     Mouth/Throat:     Mouth: Mucous membranes are moist.     Pharynx: Oropharynx is clear.  Eyes:     Extraocular Movements: Extraocular movements intact.  Cardiovascular:     Rate and Rhythm: Normal rate and regular rhythm.     Pulses: Normal pulses.     Heart sounds: Normal heart sounds.  Pulmonary:     Effort: Pulmonary effort is normal.     Breath sounds: Normal breath sounds.  Musculoskeletal:     Cervical back: Normal range of motion and neck supple.  Skin:    General: Skin is warm and dry.  Neurological:     Mental Status: She is alert and oriented to person, place, and time. Mental status is at baseline.  Psychiatric:        Mood and Affect: Mood normal.        Behavior: Behavior normal.      UC Treatments / Results  Labs (all labs ordered are listed, but only abnormal results are displayed) Labs Reviewed - No data to display  EKG   Radiology US OB Transvaginal  Result Date: 02/09/2022 CLINICAL DATA:  Bleeding. EXAM: TRANSVAGINAL OB ULTRASOUND TECHNIQUE: Transvaginal ultrasound was performed for complete evaluation of the gestation as well as the maternal uterus, adnexal regions, and pelvic cul-de-sac. COMPARISON:  Obstetrical ultrasound 01/07/2022 FINDINGS: Intrauterine gestational sac: There is a single irregularly-shaped anechoic sac-like structure containing 1 thin membrane. Yolk sac:  Not Visualized. Embryo:  Not Visualized. MSD: 19.8 mm   6 w   6 d Subchorionic hemorrhage:  None visualized. Maternal uterus/adnexae: The bilateral ovaries are visualized and appear within normal limits. No free fluid. IMPRESSION: 1. Questionable single irregularly shaped gestational sac. No yolk sac or embryo identified. Findings are concerning for failed IUP with retained products of conception when correlating with prior scan 01/07/2022. Differential diagnosis would include pseudo gestational sac with occult  ectopic pregnancy, although this is less likely. Electronically Signed   By: Darliss Cheney M.D.   On: 02/09/2022 19:39    Procedures Procedures (including critical care time)  Medications Ordered in UC Medications -  No data to display  Initial Impression / Assessment and Plan / UC Course  I have reviewed the triage vital signs and the nursing notes.  Pertinent labs & imaging results that were available during my care of the patient were reviewed by me and considered in my medical decision making (see chart for details).  Viral URI with cough  Vital signs are stable, patient is in no signs of distress, rapid flu and strep negative, COVID test pending, recommend over-the-counter medications for supportive care, work note given, may follow-up with urgent care as needed Final Clinical Impressions(s) / UC Diagnoses   Final diagnoses:  None   Discharge Instructions   None    ED Prescriptions   None    PDMP not reviewed this encounter.   Valinda Hoar, NP 02/11/22 1139

## 2022-02-11 NOTE — ED Triage Notes (Signed)
Pt reports chills, cough, nasal congestion and scratchy throat x 1 day.  Also reports she is currently having a miscarriage right now and has surgery scheduled for tomorrow.

## 2022-02-11 NOTE — Discharge Instructions (Signed)
Your symptoms today are most likely being caused by a virus and should steadily improve in time it can take up to 7 to 10 days before you truly start to see a turnaround however things will get better  Strep and flu testing negative, COVID test is pending up to 24 hours, you will be notified of positive test results only  You can take Tylenol and/or Ibuprofen as needed for fever reduction and pain relief.   For cough: honey 1/2 to 1 teaspoon (you can dilute the honey in water or another fluid).  You can also use guaifenesin and dextromethorphan for cough. You can use a humidifier for chest congestion and cough.  If you don't have a humidifier, you can sit in the bathroom with the hot shower running.      For sore throat: try warm salt water gargles, cepacol lozenges, throat spray, warm tea or water with lemon/honey, popsicles or ice, or OTC cold relief medicine for throat discomfort.   For congestion: take a daily anti-histamine like Zyrtec, Claritin, and a oral decongestant, such as pseudoephedrine.  You can also use Flonase 1-2 sprays in each nostril daily.   It is important to stay hydrated: drink plenty of fluids (water, gatorade/powerade/pedialyte, juices, or teas) to keep your throat moisturized and help further relieve irritation/discomfort.

## 2022-02-11 NOTE — Progress Notes (Signed)
Molly Hensley denies chest pain or shortness of breath. Patient denies having any s/s of Covid in her household.  Patient denies any known exposure to Covid.   Weisberg's PCP is with [facility].

## 2022-02-12 ENCOUNTER — Encounter (HOSPITAL_COMMUNITY): Payer: Self-pay | Admitting: Obstetrics & Gynecology

## 2022-02-12 ENCOUNTER — Ambulatory Visit (HOSPITAL_COMMUNITY): Payer: Medicaid Other | Admitting: Anesthesiology

## 2022-02-12 ENCOUNTER — Ambulatory Visit (HOSPITAL_BASED_OUTPATIENT_CLINIC_OR_DEPARTMENT_OTHER): Payer: Medicaid Other | Admitting: Anesthesiology

## 2022-02-12 ENCOUNTER — Encounter: Payer: Medicaid Other | Admitting: Student

## 2022-02-12 ENCOUNTER — Ambulatory Visit (HOSPITAL_COMMUNITY)
Admission: RE | Admit: 2022-02-12 | Discharge: 2022-02-12 | Disposition: A | Discharge status: Home / Self Care | Payer: Medicaid Other | Attending: Obstetrics & Gynecology | Admitting: Obstetrics & Gynecology

## 2022-02-12 ENCOUNTER — Other Ambulatory Visit: Payer: Self-pay

## 2022-02-12 ENCOUNTER — Encounter (HOSPITAL_COMMUNITY)
Admission: RE | Disposition: A | Discharge status: Home / Self Care | Payer: Self-pay | Source: Home / Self Care | Attending: Obstetrics & Gynecology

## 2022-02-12 DIAGNOSIS — O021 Missed abortion: Secondary | ICD-10-CM

## 2022-02-12 HISTORY — PX: DILATION AND EVACUATION: SHX1459

## 2022-02-12 LAB — TYPE AND SCREEN
ABO/RH(D): A POS
Antibody Screen: NEGATIVE

## 2022-02-12 LAB — CBC
HCT: 34.9 % — ABNORMAL LOW (ref 36.0–46.0)
Hemoglobin: 11.6 g/dL — ABNORMAL LOW (ref 12.0–15.0)
MCH: 31.8 pg (ref 26.0–34.0)
MCHC: 33.2 g/dL (ref 30.0–36.0)
MCV: 95.6 fL (ref 80.0–100.0)
Platelets: 262 10*3/uL (ref 150–400)
RBC: 3.65 MIL/uL — ABNORMAL LOW (ref 3.87–5.11)
RDW: 13.4 % (ref 11.5–15.5)
WBC: 7.6 10*3/uL (ref 4.0–10.5)
nRBC: 0 % (ref 0.0–0.2)

## 2022-02-12 LAB — SARS CORONAVIRUS 2 (TAT 6-24 HRS): SARS Coronavirus 2: NEGATIVE

## 2022-02-12 SURGERY — DILATION AND EVACUATION, UTERUS
Anesthesia: General | Site: Vagina

## 2022-02-12 MED ORDER — NEOSTIGMINE METHYLSULFATE 3 MG/3ML IV SOSY
PREFILLED_SYRINGE | INTRAVENOUS | Status: AC
  Filled 2022-02-12: qty 3

## 2022-02-12 MED ORDER — GLYCOPYRROLATE PF 0.2 MG/ML IJ SOSY
PREFILLED_SYRINGE | INTRAMUSCULAR | Status: AC
  Filled 2022-02-12: qty 1

## 2022-02-12 MED ORDER — LIDOCAINE 2% (20 MG/ML) 5 ML SYRINGE
INTRAMUSCULAR | Status: DC | PRN
  Administered 2022-02-12: 60 mg via INTRAVENOUS

## 2022-02-12 MED ORDER — FENTANYL CITRATE (PF) 250 MCG/5ML IJ SOLN
INTRAMUSCULAR | Status: AC
  Filled 2022-02-12: qty 5

## 2022-02-12 MED ORDER — SUCCINYLCHOLINE CHLORIDE 200 MG/10ML IV SOSY
PREFILLED_SYRINGE | INTRAVENOUS | Status: DC | PRN
  Administered 2022-02-12: 200 mg via INTRAVENOUS

## 2022-02-12 MED ORDER — DEXAMETHASONE SODIUM PHOSPHATE 10 MG/ML IJ SOLN
INTRAMUSCULAR | Status: AC
  Filled 2022-02-12: qty 1

## 2022-02-12 MED ORDER — IBUPROFEN 600 MG PO TABS: 600.0000 mg | ORAL_TABLET | Freq: Four times a day (QID) | ORAL | 0 refills | Status: DC | PRN

## 2022-02-12 MED ORDER — PROPOFOL 10 MG/ML IV BOLUS
INTRAVENOUS | Status: DC | PRN
  Administered 2022-02-12: 150 mg via INTRAVENOUS

## 2022-02-12 MED ORDER — ONDANSETRON HCL 4 MG/2ML IJ SOLN
INTRAMUSCULAR | Status: AC
  Filled 2022-02-12: qty 2

## 2022-02-12 MED ORDER — LIDOCAINE 2% (20 MG/ML) 5 ML SYRINGE
INTRAMUSCULAR | Status: AC
  Filled 2022-02-12: qty 5

## 2022-02-12 MED ORDER — NEOSTIGMINE METHYLSULFATE 10 MG/10ML IV SOLN
INTRAVENOUS | Status: DC | PRN
  Administered 2022-02-12: 1 mg via INTRAVENOUS

## 2022-02-12 MED ORDER — ONDANSETRON HCL 4 MG/2ML IJ SOLN
INTRAMUSCULAR | Status: DC | PRN
  Administered 2022-02-12: 4 mg via INTRAVENOUS

## 2022-02-12 MED ORDER — MIDAZOLAM HCL 2 MG/2ML IJ SOLN
INTRAMUSCULAR | Status: AC
  Filled 2022-02-12: qty 2

## 2022-02-12 MED ORDER — ROCURONIUM BROMIDE 10 MG/ML (PF) SYRINGE
PREFILLED_SYRINGE | INTRAVENOUS | Status: DC | PRN
  Administered 2022-02-12: 10 mg via INTRAVENOUS

## 2022-02-12 MED ORDER — SILVER NITRATE-POT NITRATE 75-25 % EX MISC
CUTANEOUS | Status: AC
  Filled 2022-02-12: qty 10

## 2022-02-12 MED ORDER — FENTANYL CITRATE (PF) 100 MCG/2ML IJ SOLN: 25.0000 ug | INTRAMUSCULAR | Status: DC | PRN

## 2022-02-12 MED ORDER — ACETAMINOPHEN 500 MG PO TABS: 1000.0000 mg | ORAL_TABLET | ORAL | Status: AC

## 2022-02-12 MED ORDER — GLYCOPYRROLATE 0.2 MG/ML IJ SOLN
INTRAMUSCULAR | Status: DC | PRN
  Administered 2022-02-12: .2 mg via INTRAVENOUS

## 2022-02-12 MED ORDER — KETOROLAC TROMETHAMINE 15 MG/ML IJ SOLN: 15.0000 mg | INTRAMUSCULAR | Status: AC

## 2022-02-12 MED ORDER — KETOROLAC TROMETHAMINE 15 MG/ML IJ SOLN
INTRAMUSCULAR | Status: AC
  Administered 2022-02-12: 15 mg via INTRAVENOUS
  Filled 2022-02-12: qty 1

## 2022-02-12 MED ORDER — ALBUTEROL SULFATE HFA 108 (90 BASE) MCG/ACT IN AERS
INHALATION_SPRAY | RESPIRATORY_TRACT | Status: DC | PRN
  Administered 2022-02-12 (×3): 4 via RESPIRATORY_TRACT

## 2022-02-12 MED ORDER — CHLORHEXIDINE GLUCONATE 0.12 % MT SOLN: 15.0000 mL | Freq: Once | OROMUCOSAL | Status: AC

## 2022-02-12 MED ORDER — LACTATED RINGERS IV SOLN: INTRAVENOUS | Status: DC

## 2022-02-12 MED ORDER — ORAL CARE MOUTH RINSE: 15.0000 mL | Freq: Once | OROMUCOSAL | Status: AC

## 2022-02-12 MED ORDER — ALBUTEROL SULFATE HFA 108 (90 BASE) MCG/ACT IN AERS
INHALATION_SPRAY | RESPIRATORY_TRACT | Status: AC
  Filled 2022-02-12: qty 6.7

## 2022-02-12 MED ORDER — SILVER NITRATE-POT NITRATE 75-25 % EX MISC
CUTANEOUS | Status: DC | PRN
  Administered 2022-02-12: 1

## 2022-02-12 MED ORDER — BUPIVACAINE-EPINEPHRINE (PF) 0.25% -1:200000 IJ SOLN
INTRAMUSCULAR | Status: AC
  Filled 2022-02-12: qty 30

## 2022-02-12 MED ORDER — ACETAMINOPHEN 325 MG PO TABS: 650.0000 mg | ORAL_TABLET | Freq: Four times a day (QID) | ORAL | Status: DC | PRN

## 2022-02-12 MED ORDER — CHLORHEXIDINE GLUCONATE 0.12 % MT SOLN
OROMUCOSAL | Status: AC
  Administered 2022-02-12: 15 mL via OROMUCOSAL
  Filled 2022-02-12: qty 15

## 2022-02-12 MED ORDER — FENTANYL CITRATE (PF) 250 MCG/5ML IJ SOLN
INTRAMUSCULAR | Status: DC | PRN
Start: 2022-02-12 — End: 2022-02-12
  Administered 2022-02-12 (×2): 50 ug via INTRAVENOUS

## 2022-02-12 MED ORDER — DEXAMETHASONE SODIUM PHOSPHATE 10 MG/ML IJ SOLN
INTRAMUSCULAR | Status: DC | PRN
  Administered 2022-02-12: 10 mg via INTRAVENOUS

## 2022-02-12 MED ORDER — MEDROXYPROGESTERONE ACETATE 150 MG/ML IM SUSP
150.0000 mg | Freq: Once | INTRAMUSCULAR | Status: AC
  Administered 2022-02-12: 150 mg via INTRAMUSCULAR
  Filled 2022-02-12: qty 1

## 2022-02-12 MED ORDER — PROPOFOL 10 MG/ML IV BOLUS
INTRAVENOUS | Status: AC
  Filled 2022-02-12: qty 20

## 2022-02-12 MED ORDER — MIDAZOLAM HCL 5 MG/5ML IJ SOLN
INTRAMUSCULAR | Status: DC | PRN
  Administered 2022-02-12: 2 mg via INTRAVENOUS

## 2022-02-12 MED ORDER — POVIDONE-IODINE 10 % EX SWAB
2.0000 | Freq: Once | CUTANEOUS | Status: AC
  Administered 2022-02-12: 2 via TOPICAL

## 2022-02-12 MED ORDER — ACETAMINOPHEN 500 MG PO TABS
ORAL_TABLET | ORAL | Status: AC
  Administered 2022-02-12: 1000 mg via ORAL
  Filled 2022-02-12: qty 2

## 2022-02-12 MED ORDER — 0.9 % SODIUM CHLORIDE (POUR BTL) OPTIME
TOPICAL | Status: DC | PRN
  Administered 2022-02-12: 1000 mL

## 2022-02-12 SURGICAL SUPPLY — 22 items
CATH ROBINSON RED A/P 16FR (CATHETERS) ×3 IMPLANT
FILTER UTR ASPR ASSEMBLY (MISCELLANEOUS) ×3 IMPLANT
GAUZE 4X4 16PLY ~~LOC~~+RFID DBL (SPONGE) ×3 IMPLANT
GLOVE BIO SURGEON STRL SZ 6.5 (GLOVE) ×3 IMPLANT
GLOVE BIOGEL PI IND STRL 7.0 (GLOVE) ×4 IMPLANT
GLOVE BIOGEL PI INDICATOR 7.0 (GLOVE) ×2
GOWN STRL REUS W/ TWL LRG LVL3 (GOWN DISPOSABLE) ×4 IMPLANT
GOWN STRL REUS W/TWL LRG LVL3 (GOWN DISPOSABLE) ×4
HOSE CONNECTING 18IN BERKELEY (TUBING) ×3 IMPLANT
KIT BERKELEY 1ST TRI 3/8 NO TR (MISCELLANEOUS) ×3 IMPLANT
KIT BERKELEY 1ST TRIMESTER 3/8 (MISCELLANEOUS) ×3 IMPLANT
NS IRRIG 1000ML POUR BTL (IV SOLUTION) ×3 IMPLANT
PACK VAGINAL MINOR WOMEN LF (CUSTOM PROCEDURE TRAY) ×3 IMPLANT
PAD OB MATERNITY 4.3X12.25 (PERSONAL CARE ITEMS) ×3 IMPLANT
SET BERKELEY SUCTION TUBING (SUCTIONS) ×3 IMPLANT
SPIKE FLUID TRANSFER (MISCELLANEOUS) ×3 IMPLANT
TOWEL GREEN STERILE FF (TOWEL DISPOSABLE) ×6 IMPLANT
UNDERPAD 30X36 HEAVY ABSORB (UNDERPADS AND DIAPERS) ×3 IMPLANT
VACURETTE 10 RIGID CVD (CANNULA) IMPLANT
VACURETTE 7MM CVD STRL WRAP (CANNULA) ×1 IMPLANT
VACURETTE 8 RIGID CVD (CANNULA) IMPLANT
VACURETTE 9 RIGID CVD (CANNULA) IMPLANT

## 2022-02-12 NOTE — Transfer of Care (Signed)
Immediate Anesthesia Transfer of Care Note  Patient: Molly Hensley  Procedure(s) Performed: DILATATION AND EVACUATION (Vagina )  Patient Location: PACU  Anesthesia Type:General  Level of Consciousness: awake, alert  and oriented  Airway & Oxygen Therapy: Patient Spontanous Breathing and Patient connected to nasal cannula oxygen  Post-op Assessment: Report given to RN, Post -op Vital signs reviewed and stable, Patient moving all extremities X 4 and Patient able to stick tongue midline  Post vital signs: Reviewed  Last Vitals:  Vitals Value Taken Time  BP 108/72 02/12/22 1605  Temp 97.9   Pulse 92 02/12/22 1608  Resp 16 02/12/22 1608  SpO2 99 % 02/12/22 1608  Vitals shown include unvalidated device data.  Last Pain:  Vitals:   02/12/22 1212  TempSrc:   PainSc: 6       Patients Stated Pain Goal: 3 (02/12/22 1212)  Complications: No notable events documented.

## 2022-02-12 NOTE — Discharge Instructions (Addendum)
HOME INSTRUCTIONS  Please note any unusual or excessive bleeding, pain, swelling. Mild dizziness or drowsiness are normal for about 24 hours after surgery.   Shower when comfortable  Restrictions: No driving for 24 hours or while taking pain medications.  Activity:  No heavy lifting (> 10 lbs), nothing in vagina (no tampons, douching, or intercourse) x 4 weeks; no tub baths for 4 weeks Vaginal spotting is expected but if your bleeding is heavy, period like,  please call the office   Diet:  You may return to your regular diet.  Do not eat large meals.  Eat small frequent meals throughout the day.  Continue to drink a good amount of water at least 6-8 glasses of water per day, hydration is very important for the healing process.  Pain Management: Take Ibuprofen every 6 hours with food as needed for pain.  You may also take over the counter tylenol as needed.  Please feel free to use a heating pad if needed.  Alcohol -- Avoid for 24 hours and while taking pain medications.  Nausea: Take sips of ginger ale or soda  Fever -- Call physician if temperature over 101 degrees  Follow up:  If you do not already have a follow up appointment scheduled, please call the office to schedule. If you experience fever (a temperature greater than 100.4), pain unrelieved by pain medication, shortness of breath, swelling of a single leg, or any other symptoms which are concerning to you please the office immediately.

## 2022-02-12 NOTE — Op Note (Signed)
Preoperative diagnosis: Missed AB  Postoperative diagnosis: Same  Anesthesia: General  Procedure: Dilatation and evacuation  Surgeon: Dr. Myna Hidalgo Assistant: Dr. Patrcia Dolly  Estimated blood loss: 100cc UOP: 75cc IVF: 400cc  Procedure:  Pt was taken to the OR where general anesthesia was performed.  She was placed in lithotomy position.  She was prepped and draped in a sterile fashion.  The bladder was drained using a red rubber.  A weighted speculum is inserted in the vagina and the anterior lip of the cervix was grasped with a tenaculum forcep. The uterus was then sounded at 7c, cm. The cervix was dilated using Hank dilators. Using a #7 curved cannula, we proceed with evacuation of products of conception without difficulty. We then proceed with sharp curettage of the uterine cavity to confirm complete evacuation.  The suction was then re-introduced for one final sweep of the uterus.  Instruments are then removed. Instrument and sponge count is complete x2.  The procedure was well tolerated by the patient and she was taken to recovery room in a well and stable condition.  Specimen: Products of conception sent to pathology   Myna Hidalgo, DO Attending Obstetrician & Gynecologist, [facility], [facility]

## 2022-02-12 NOTE — Anesthesia Procedure Notes (Signed)
Procedure Name: Intubation Date/Time: 02/12/2022 3:28 PM  Performed by: Cy Blamer, CRNAPre-anesthesia Checklist: Patient identified, Emergency Drugs available, Suction available and Patient being monitored Patient Re-evaluated:Patient Re-evaluated prior to induction Oxygen Delivery Method: Circle system utilized Preoxygenation: Pre-oxygenation with 100% oxygen Induction Type: IV induction and Rapid sequence Laryngoscope Size: Miller and 2 Grade View: Grade I Tube type: Oral Tube size: 7.0 mm Number of attempts: 1 Airway Equipment and Method: Stylet and Oral airway Placement Confirmation: ETT inserted through vocal cords under direct vision, positive ETCO2 and breath sounds checked- equal and bilateral Secured at: 21 cm Tube secured with: Tape Dental Injury: Teeth and Oropharynx as per pre-operative assessment

## 2022-02-12 NOTE — H&P (Signed)
[facility]necology Attending History and Physical  Molly Hensley is a 26 y.o. G3P1011 at [redacted]w[redacted]d measuring ~ 6wk by [place] who presents for failed pregnancy, scheduled D&E  In review, she presented to the office on 8/12 with vaginal bleeding.  [place] was completed that showed an irregular sac supportive of a failed pregnancy.  Today she notes that she continues to note some light spotting and cramping.  No other acute complaints.  Denies fevers, chills, sweats, dysuria, nausea, vomiting, other GI or GU symptoms or other general symptoms.  Past Medical History:  Diagnosis Date   Asthma    Childhood   Chlamydia    Headache    Past Surgical History:  Procedure Laterality Date   INDUCED ABORTION     TOOTH EXTRACTION     OB History  Gravida Para Term Preterm AB Living  3 1 1   1 1   SAB IAB Ectopic Multiple Live Births    1   0 1    # Outcome Date GA Lbr Len/2nd Weight Sex Delivery Anes PTL Lv  3 Gravida           2 Term 07/20/20 [redacted]w[redacted]d 04:21 / 00:59 3164 g F Vag-Spont EPI  LIV     Birth Comments: wnl  1 IAB 03/17/19          Patient denies any other pertinent gynecologic issues.  No current facility-administered medications on file prior to encounter.   Current Outpatient Medications on File Prior to Encounter  Medication Sig Dispense Refill   metroNIDAZOLE (FLAGYL) 500 MG tablet Take 500 mg by mouth in the morning and at bedtime.     Prenatal Vit-Fe Fumarate-FA (MULTIVITAMIN-PRENATAL) 27-0.8 MG TABS tablet Take 1 tablet by mouth daily with breakfast.     No Known Allergies  Social History:   reports that she quit smoking about 6 weeks ago. Her smoking use included cigarettes. She smoked an average of .25 packs per day. She has never used smokeless tobacco. She reports that she does not currently use drugs after having used the following drugs: Marijuana. She reports that she does not drink alcohol. Family History  Problem Relation Age of Onset   Hypertension  Mother    Epilepsy Mother    Heart disease Mother    Cancer Maternal Grandmother     Review of Systems: Pertinent items noted in HPI and remainder of comprehensive ROS otherwise negative.  PHYSICAL EXAM: Last menstrual period 10/24/2021, unknown if currently breastfeeding. CONSTITUTIONAL: Well-developed, well-nourished female in no acute distress.  SKIN: Skin is warm and dry. No rash noted. Not diaphoretic. No erythema. No pallor. NEUROLOGIC: Alert and oriented to person, place, and time. Normal reflexes, muscle tone coordination. No cranial nerve deficit noted. PSYCHIATRIC: Normal mood and affect. Normal behavior. Normal judgment and thought content. CARDIOVASCULAR: Normal heart rate noted, regular rhythm RESPIRATORY: Effort and breath sounds normal, no problems with respiration noted ABDOMEN: Soft, nontender, nondistended. PELVIC: deferred MUSCULOSKELETAL: no calf tenderness bilaterally EXT: no edema bilaterally, normal pulses  Labs: Results for orders placed or performed during the hospital encounter of 02/11/22 (from the past 336 hour(s))  SARS CORONAVIRUS 2 (TAT 6-24 HRS) Anterior Nasal Swab   Collection Time: 02/11/22 11:02 AM   Specimen: Anterior Nasal Swab  Result Value Ref Range   SARS Coronavirus 2 NEGATIVE NEGATIVE  POCT Rapid Strep A   Collection Time: 02/11/22 11:14 AM  Result Value Ref Range   Streptococcus, Group A Screen (Direct) NEGATIVE NEGATIVE  POC Influenza A & B Ag (Urgent Care)   Collection Time: 02/11/22 11:24 AM  Result Value Ref Range   INFLUENZA A ANTIGEN, POC NEGATIVE NEGATIVE   INFLUENZA B ANTIGEN, POC NEGATIVE NEGATIVE    Imaging Studies: US OB Transvaginal  Result Date: 02/09/2022 CLINICAL DATA:  Bleeding. EXAM: TRANSVAGINAL OB ULTRASOUND TECHNIQUE: Transvaginal ultrasound was performed for complete evaluation of the gestation as well as the maternal uterus, adnexal regions, and pelvic cul-de-sac. COMPARISON:  Obstetrical ultrasound 01/07/2022  FINDINGS: Intrauterine gestational sac: There is a single irregularly-shaped anechoic sac-like structure containing 1 thin membrane. Yolk sac:  Not Visualized. Embryo:  Not Visualized. MSD: 19.8 mm   6 w   6 d Subchorionic hemorrhage:  None visualized. Maternal uterus/adnexae: The bilateral ovaries are visualized and appear within normal limits. No free fluid. IMPRESSION: 1. Questionable single irregularly shaped gestational sac. No yolk sac or embryo identified. Findings are concerning for failed IUP with retained products of conception when correlating with prior scan 01/07/2022. Differential diagnosis would include pseudo gestational sac with occult ectopic pregnancy, although this is less likely. Electronically Signed   By: Darliss Cheney M.D.   On: 02/09/2022 19:39    Assessment: Miscarriage   Plan: Dilation and evacuation due to miscarriage -IV Doxy -NPO -LR @ 125cc/hr -SCDs to OR -Risk/benefits and alternatives reviewed with the patient including but not limited to risk of bleeding, infection and injury to surrounding organs.  Questions and concerns were addressed and pt desires to proceed  Myna Hidalgo, DO Attending Obstetrician & Gynecologist, [facility] for [facility], [facility]

## 2022-02-12 NOTE — Anesthesia Preprocedure Evaluation (Addendum)
Anesthesia Evaluation  Patient identified by MRN, date of birth, ID band Patient awake    Reviewed: Allergy & Precautions, NPO status , Patient's Chart, lab work & pertinent test results  Airway Mallampati: III  TM Distance: >3 FB Neck ROM: Full    Dental no notable dental hx. (+) Teeth Intact, Dental Advisory Given   Pulmonary asthma , Current Smoker and Patient abstained from smoking.,    Pulmonary exam normal breath sounds clear to auscultation       Cardiovascular negative cardio ROS Normal cardiovascular exam Rhythm:Regular Rate:Normal     Neuro/Psych  Headaches, negative psych ROS   GI/Hepatic negative GI ROS, Neg liver ROS,   Endo/Other  negative endocrine ROS  Renal/GU negative Renal ROS  negative genitourinary   Musculoskeletal negative musculoskeletal ROS (+)   Abdominal   Peds  Hematology negative hematology ROS (+)   Anesthesia Other Findings G3P1011 at [redacted]w[redacted]d measuring ~ 6wk by [place] who presents for failed pregnancy, scheduled D&E  Reproductive/Obstetrics                            Anesthesia Physical Anesthesia Plan  ASA: 2  Anesthesia Plan: General   Post-op Pain Management: Tylenol PO (pre-op)*   Induction: Intravenous and Rapid sequence  PONV Risk Score and Plan: 3 and Midazolam, Dexamethasone and Ondansetron  Airway Management Planned: Oral ETT  Additional Equipment:   Intra-op Plan:   Post-operative Plan: Extubation in OR  Informed Consent: I have reviewed the patients History and Physical, chart, labs and discussed the procedure including the risks, benefits and alternatives for the proposed anesthesia with the patient or authorized representative who has indicated his/her understanding and acceptance.     Dental advisory given  Plan Discussed with: CRNA  Anesthesia Plan Comments:         Anesthesia Quick Evaluation

## 2022-02-13 ENCOUNTER — Encounter (HOSPITAL_COMMUNITY): Payer: Self-pay | Admitting: Obstetrics & Gynecology

## 2022-02-13 ENCOUNTER — Other Ambulatory Visit: Payer: Self-pay

## 2022-02-13 NOTE — Anesthesia Postprocedure Evaluation (Signed)
Anesthesia Post Note  Patient: Avrielle L Sayed  Procedure(s) Performed: DILATATION AND EVACUATION (Vagina )     Patient location during evaluation: PACU Anesthesia Type: General Level of consciousness: awake and alert Pain management: pain level controlled Vital Signs Assessment: post-procedure vital signs reviewed and stable Respiratory status: spontaneous breathing, nonlabored ventilation, respiratory function stable and patient connected to nasal cannula oxygen Cardiovascular status: blood pressure returned to baseline and stable Postop Assessment: no apparent nausea or vomiting Anesthetic complications: no   No notable events documented.  Last Vitals:  Vitals:   02/12/22 1630 02/12/22 1645  BP: 111/70 103/77  Pulse: 77 63  Resp: 20 20  Temp:  36.7 C  SpO2: 100% 100%    Last Pain:  Vitals:   02/12/22 1645  TempSrc:   PainSc: 0-No pain   Pain Goal: Patients Stated Pain Goal: 3 (02/12/22 1212)                 Keary Hanak L Zakery [place]

## 2022-02-14 LAB — SURGICAL PATHOLOGY

## 2022-02-26 ENCOUNTER — Ambulatory Visit (HOSPITAL_COMMUNITY): Payer: Medicaid Other

## 2022-02-26 ENCOUNTER — Encounter (HOSPITAL_COMMUNITY): Payer: Self-pay

## 2022-02-26 ENCOUNTER — Ambulatory Visit (HOSPITAL_COMMUNITY)
Admission: EM | Admit: 2022-02-26 | Discharge: 2022-02-26 | Disposition: A | Discharge status: Home / Self Care | Payer: Medicaid Other | Attending: Internal Medicine | Admitting: Internal Medicine

## 2022-02-26 ENCOUNTER — Ambulatory Visit (INDEPENDENT_AMBULATORY_CARE_PROVIDER_SITE_OTHER): Payer: Medicaid Other

## 2022-02-26 DIAGNOSIS — H5789 Other specified disorders of eye and adnexa: Secondary | ICD-10-CM | POA: Diagnosis not present

## 2022-02-26 DIAGNOSIS — R55 Syncope and collapse: Secondary | ICD-10-CM

## 2022-02-26 DIAGNOSIS — S0990XA Unspecified injury of head, initial encounter: Secondary | ICD-10-CM

## 2022-02-26 DIAGNOSIS — S0181XA Laceration without foreign body of other part of head, initial encounter: Secondary | ICD-10-CM | POA: Diagnosis not present

## 2022-02-26 LAB — CBC
HCT: 34.6 % — ABNORMAL LOW (ref 36.0–46.0)
Hemoglobin: 11.8 g/dL — ABNORMAL LOW (ref 12.0–15.0)
MCH: 31.7 pg (ref 26.0–34.0)
MCHC: 34.1 g/dL (ref 30.0–36.0)
MCV: 93 fL (ref 80.0–100.0)
Platelets: 345 10*3/uL (ref 150–400)
RBC: 3.72 MIL/uL — ABNORMAL LOW (ref 3.87–5.11)
RDW: 13.7 % (ref 11.5–15.5)
WBC: 12.2 10*3/uL — ABNORMAL HIGH (ref 4.0–10.5)
nRBC: 0 % (ref 0.0–0.2)

## 2022-02-26 LAB — CBG MONITORING, ED: Glucose-Capillary: 131 mg/dL — ABNORMAL HIGH (ref 70–99)

## 2022-02-26 LAB — BASIC METABOLIC PANEL
Anion gap: 6 (ref 5–15)
BUN: 12 mg/dL (ref 6–20)
CO2: 20 mmol/L — ABNORMAL LOW (ref 22–32)
Calcium: 9.3 mg/dL (ref 8.9–10.3)
Chloride: 113 mmol/L — ABNORMAL HIGH (ref 98–111)
Creatinine, Ser: 0.82 mg/dL (ref 0.44–1.00)
GFR, Estimated: >60 mL/min (ref >60)
Glucose, Bld: 109 mg/dL — ABNORMAL HIGH (ref 70–99)
Potassium: 3.6 mmol/L (ref 3.5–5.1)
Sodium: 139 mmol/L (ref 135–145)

## 2022-02-26 MED ORDER — LIDOCAINE-EPINEPHRINE 1 %-1:100000 IJ SOLN
INTRAMUSCULAR | Status: AC
  Filled 2022-02-26: qty 1

## 2022-02-26 MED ORDER — IBUPROFEN 600 MG PO TABS: 600.0000 mg | ORAL_TABLET | Freq: Four times a day (QID) | ORAL | 0 refills | Status: DC | PRN

## 2022-02-26 MED ORDER — TETANUS-DIPHTH-ACELL PERTUSSIS 5-2.5-18.5 LF-MCG/0.5 IM SUSY: 0.5000 mL | PREFILLED_SYRINGE | Freq: Once | INTRAMUSCULAR | Status: DC

## 2022-02-26 NOTE — Discharge Instructions (Addendum)
Return to urgent care to have your sutures removed in 5 days. Do not get the sutures wet for 24 hours. Avoid placing lotions, ointments, and powders to your face until the wound fully heals.  You may take ibuprofen 600 mg every 6 hours as needed for pain and inflammation.  Continue applying ice to your face 20 minutes on 20 minutes off over the next few days.  Return to urgent care for any new or worsening symptoms.  If you develop sudden onset dizziness, severe headache, eye pain, or signs of infection such as redness, swelling, pus, or worsening pain to your wound on your face, please return to urgent care.  If your symptoms are severe, please go to the nearest emergency department.  I hope you feel better!

## 2022-02-26 NOTE — ED Provider Notes (Signed)
[facility]    CSN: [id number] Arrival date & time: 02/26/22  1521      History   Chief Complaint Chief Complaint  Patient presents with   Head Injury   Loss of Consciousness   Dizziness    HPI Molly Hensley is a 26 y.o. female.   Patient presents urgent care for evaluation after episode of syncope last night and fall resulting in head trauma.  Patient underwent a D&C procedure on February 12, 2022 due to failed intrauterine pregnancy and has been changing her peri-pad 3 times daily ever since the procedure.  Yesterday, she became dizzy and nauseous in the morning.  Her symptoms persisted throughout the day yesterday and worsened last night.  Patient states that she ate breakfast yesterday but did not eat anything else throughout the day.  Patient got into the shower last night while dizzy and states that her boyfriend heard a loud thud in the bathroom.  She has decreased memory of the syncopal event, but states that her boyfriend had to "wake her up and tell her that she passed out".  She has a laceration to the lateral aspect of the right eye as well as significant periorbital edema and pain.  She denies pain to the eye itself.  Eye is very red and swollen. She does not use blood thinners and denies dizziness at this time.  She has not eaten today.  She is not a diabetic and denies history of gestational diabetes with previous pregnancy  States she had gestational hypertension but this has since resolved after delivery of her now 64-year-old.  Denies recent medication changes, chest pain, shortness of breath, and URI symptoms.  She is reporting 6 on a scale of 0-10 pain to the periorbital area of the right eye for which she took Tylenol 1000 mg prior to arrival urgent care.  Denies use of any other medications prior to arrival.  She has also been placing ice to the right eye and states that this has been helping significantly with her pain and discomfort.      Past Medical  History:  Diagnosis Date   Asthma    Childhood   Chlamydia    Headache     Patient Active Problem List   Diagnosis Date Noted   Supervision of other normal pregnancy, antepartum 01/29/2022   Acute on chronic anemia 07/21/2020   Tobacco use disorder 03/08/2014    Past Surgical History:  Procedure Laterality Date   DILATION AND EVACUATION N/A 02/12/2022   Procedure: DILATATION AND EVACUATION;  Surgeon: Myna Hidalgo, DO;  Location: [facility] OR;  Service: Gynecology;  Laterality: N/A;   INDUCED ABORTION     TOOTH EXTRACTION      OB History     Gravida  3   Para  1   Term  1   Preterm      AB  1   Living  1      SAB      IAB  1   Ectopic      Multiple  0   Live Births  1            Home Medications    Prior to Admission medications   Medication Sig Start Date End Date Taking? Authorizing Provider  acetaminophen (TYLENOL) 325 MG tablet Take 2 tablets (650 mg total) by mouth every 6 (six) hours as needed. 02/12/22   Myna Hidalgo, DO  ibuprofen (ADVIL) 600 MG tablet Take  1 tablet (600 mg total) by mouth every 6 (six) hours as needed. 02/26/22   Carlisle Beers, FNP  metroNIDAZOLE (FLAGYL) 500 MG tablet Take 500 mg by mouth in the morning and at bedtime.    [provider]  Prenatal Vit-Fe Fumarate-FA (MULTIVITAMIN-PRENATAL) 27-0.8 MG TABS tablet Take 1 tablet by mouth daily with breakfast.    [provider]    Family History Family History  Problem Relation Age of Onset   Hypertension Mother    Epilepsy Mother    Heart disease Mother    Cancer Maternal Grandmother     Social History Social History   Tobacco Use   Smoking status: Former    Packs/day: 0.25    Types: Cigarettes    Quit date: 01/01/2022    Years since quitting: 0.1   Smokeless tobacco: Never   Tobacco comments:    cutting down  Vaping Use   Vaping Use: Never used  Substance Use Topics   Alcohol use: No    Alcohol/week: 0.0 standard drinks of alcohol    Drug use: Not Currently    Types: Marijuana    Comment: Last smoked August 2023     Allergies   Patient has no known allergies.   Review of Systems Review of Systems Per HPI  Physical Exam Triage Vital Signs ED Triage Vitals  Enc Vitals Group     BP 02/26/22 1528 109/70     Pulse Rate 02/26/22 1528 (!) 113     Resp 02/26/22 1528 16     Temp --      Temp src --      SpO2 02/26/22 1528 100 %     Weight 02/26/22 1530 205 lb (93 kg)     Height 02/26/22 1530 5\' 3"  (1.6 m)     Head Circumference --      Peak Flow --      Pain Score 02/26/22 1530 7     Pain Loc --      Pain Edu? --      Excl. in GC? --    No data found.  Updated Vital Signs BP 109/70 (BP Location: Right Arm)   Pulse (!) 113   Resp 16   Ht 5\' 3"  (1.6 m)   Wt 205 lb (93 kg)   LMP 10/24/2021 (Exact Date)   SpO2 100%   BMI 36.31 kg/m   Visual Acuity Right Eye Distance:   Left Eye Distance:   Bilateral Distance:    Right Eye Near:   Left Eye Near:    Bilateral Near:     Physical Exam Vitals and nursing note reviewed.  Constitutional:      Appearance: Normal appearance. She is obese. She is not ill-appearing or toxic-appearing.     Comments: Very pleasant patient sitting on exam in position of comfort table in no acute distress.   HENT:     Head: Normocephalic and atraumatic.     Right Ear: Hearing, tympanic membrane, ear canal and external ear normal.     Left Ear: Hearing, tympanic membrane, ear canal and external ear normal.     Nose: Nose normal.     Mouth/Throat:     Lips: Pink.     Mouth: Mucous membranes are moist.     Pharynx: No posterior oropharyngeal erythema.  Eyes:     General: Lids are normal. Lids are everted, no foreign bodies appreciated. Vision grossly intact. Gaze aligned appropriately. No visual field deficit.  Extraocular Movements: Extraocular movements intact.     Right eye: No nystagmus.     Left eye: No nystagmus.     Conjunctiva/sclera:     Right eye: Chemosis,  exudate and hemorrhage present.     Pupils: Pupils are equal, round, and reactive to light.     Comments: EOMs normal without pain or dizziness elicited.  Significant swelling to the periorbital area to the right side.  See image below for further detail.  Cardiovascular:     Rate and Rhythm: Normal rate and regular rhythm.     Heart sounds: Normal heart sounds, S1 normal and S2 normal.  Pulmonary:     Effort: Pulmonary effort is normal. No respiratory distress.     Breath sounds: Normal breath sounds and air entry.  Abdominal:     Palpations: Abdomen is soft.  Musculoskeletal:     Cervical back: Neck supple.  Skin:    General: Skin is warm and dry.     Capillary Refill: Capillary refill takes less than 2 seconds.     Findings: No rash.     Comments: Laceration present to the lateral aspect of the right eye.  See image below for further detail.  Neurological:     General: No focal deficit present.     Mental Status: She is alert and oriented to person, place, and time. Mental status is at baseline.     Cranial Nerves: Cranial nerves 2-12 are intact. No dysarthria or facial asymmetry.     Sensory: Sensation is intact.     Motor: Motor function is intact.     Coordination: Coordination is intact. Romberg sign negative. Finger-Nose-Finger Test normal.     Gait: Gait is intact.     Comments: 5/5 power throughout.  No focal neurologic deficit.  No facial asymmetry.  Memory is intact.  Psychiatric:        Mood and Affect: Mood normal.        Speech: Speech normal.        Behavior: Behavior normal.        Thought Content: Thought content normal.        Judgment: Judgment normal.            UC Treatments / Results  Labs (all labs ordered are listed, but only abnormal results are displayed) Labs Reviewed  CBC - Abnormal; Notable for the following components:      Result Value   WBC 12.2 (*)    RBC 3.72 (*)    Hemoglobin 11.8 (*)    HCT 34.6 (*)    All other components  within normal limits  BASIC METABOLIC PANEL - Abnormal; Notable for the following components:   Chloride 113 (*)    CO2 20 (*)    Glucose, Bld 109 (*)    All other components within normal limits  CBG MONITORING, ED - Abnormal; Notable for the following components:   Glucose-Capillary 131 (*)    All other components within normal limits    EKG   Radiology No results found.  Procedures Laceration Repair  Date/Time: 02/26/2022 6:00 PM  Performed by: Carlisle Beers, FNP Authorized by: Carlisle Beers, FNP   Consent:    Consent obtained:  Verbal   Consent given by:  Patient   Risks, benefits, and alternatives were discussed: yes     Risks discussed:  Need for additional repair, infection, pain, retained foreign body, poor cosmetic result, tendon damage, vascular damage, poor wound healing and nerve  damage   Alternatives discussed:  No treatment Universal protocol:    Procedure explained and questions answered to patient or proxy's satisfaction: yes     Patient identity confirmed:  Verbally with patient Anesthesia:    Anesthesia method:  Local infiltration   Local anesthetic:  Lidocaine 1% WITH epi Laceration details:    Location:  Face   Face location:  R eyebrow   Length (cm):  1.5   Depth (mm):  5 Treatment:    Area cleansed with:  Povidone-iodine   Amount of cleaning:  Standard Skin repair:    Repair method:  Sutures   Suture size:  5-0   Suture material:  Prolene   Suture technique:  Simple interrupted   Number of sutures:  3 Approximation:    Approximation:  Close Repair type:    Repair type:  Simple Post-procedure details:    Dressing:  Open (no dressing)   Procedure completion:  Tolerated well, no immediate complications  (including critical care time)  Medications Ordered in UC Medications - No data to display  Initial Impression / Assessment and Plan / UC Course  I have reviewed the triage vital signs and the nursing notes.  Pertinent  labs & imaging results that were available during my care of the patient were reviewed by me and considered in my medical decision making (see chart for details).   1.  Syncope and periorbital swelling Neurologic exam is nonfocal in the clinic.  She is without eye pain.  No indication for head CT based on [place] CT rules at this time.  CBC and BMP collected to rule out anemia and acute electrolyte imbalance since she is status post dilation and curettage and has continued to experience vaginal bleeding.  Although vaginal bleeding is an expected finding after this procedure, we will investigate for possible iron deficiency anemia. Suspect syncopal episode likely caused by hypoglycemic episode. CBG 131 today.  Patient may take ibuprofen 600 mg every 6 hours as needed for pain and inflammation.  Advised patient to continue applying ice to the face 20 minutes on 20 minutes off for the next few days to reduce swelling and inflammation to the eye.  Facial laceration repaired in the clinic as stated in above note.  Advised patient to avoid placing lotions, ointments, and powders to the face until the laceration fully heals.  Suture removal in 5 days advised.  Return to urgent care if new or worsening signs of infection to the wound.  Last tetanus shot was last year during pregnancy and there is no indication to update tetanus shot at today's visit.  Patient is agreeable with the plan.  Strict ED return precautions given.   Discussed physical exam and available lab work findings in clinic with patient.  Counseled patient regarding appropriate use of medications and potential side effects for all medications recommended or prescribed today. Discussed red flag signs and symptoms of worsening condition,when to call the PCP office, return to urgent care, and when to seek higher level of care in the emergency department. Patient verbalizes understanding and agreement with plan. All questions answered. Patient discharged  in stable condition.  Final Clinical Impressions(s) / UC Diagnoses   Final diagnoses:  Syncope, unspecified syncope type  Periorbital swelling  Facial laceration, initial encounter     Discharge Instructions      Return to urgent care to have your sutures removed in 5 days. Do not get the sutures wet for 24 hours. Avoid placing lotions, ointments, and  powders to your face until the wound fully heals.  You may take ibuprofen 600 mg every 6 hours as needed for pain and inflammation.  Continue applying ice to your face 20 minutes on 20 minutes off over the next few days.  Return to urgent care for any new or worsening symptoms.  If you develop sudden onset dizziness, severe headache, eye pain, or signs of infection such as redness, swelling, pus, or worsening pain to your wound on your face, please return to urgent care.  If your symptoms are severe, please go to the nearest emergency department.  I hope you feel better!     ED Prescriptions     Medication Sig Dispense Auth. Provider   ibuprofen (ADVIL) 600 MG tablet Take 1 tablet (600 mg total) by mouth every 6 (six) hours as needed. 30 tablet Carlisle Beers, FNP      PDMP not reviewed this encounter.   Carlisle Beers, [place] 03/02/22 1104

## 2022-02-26 NOTE — ED Triage Notes (Signed)
Patient states she was not feeling well all day yesterday. While in the shower Patient sates next thing she new her boyfriend was waking her up and she was on the ground. This happened last night. Unknown how long Patient was unconscious.   Patient had hit her head.   Earlier that day she had a headache, has been loosing blood since her surgery 02/12/22. Patient was dizzy and felt like she was about to pass out.   Patient  having blurred vision and a laceration over the right eye. Swelling to the right eye as well.

## 2022-03-01 DIAGNOSIS — Z419 Encounter for procedure for purposes other than remedying health state, unspecified: Secondary | ICD-10-CM | POA: Diagnosis not present

## 2022-03-10 NOTE — Progress Notes (Unsigned)
   GYNECOLOGY OFFICE VISIT NOTE  History:   Molly Hensley is a 26 y.o. G3P1011 here today for follow up after D&E for SAB. She had some bleeding 2 weeks postop but hgb was normal. She had a syncopal episode and fall and hit her head with a lac and was seen in the ED. Stopped bleeding on 9/1. No cramping - feeling well.   She denies any abnormal vaginal discharge, bleeding, pelvic pain or other concerns.     Past Medical History:  Diagnosis Date   Asthma    Childhood   Chlamydia    Headache     Past Surgical History:  Procedure Laterality Date   DILATION AND EVACUATION N/A 02/12/2022   Procedure: DILATATION AND EVACUATION;  Surgeon: Myna Hidalgo, DO;  Location: [facility];  Service: Gynecology;  Laterality: N/A;   INDUCED ABORTION     TOOTH EXTRACTION      The following portions of the patient's history were reviewed and updated as appropriate: allergies, current medications, past family history, past medical history, past social history, past surgical history and problem list.   Health Maintenance:   Pap: Diagnosis  Date Value Ref Range Status  01/31/2021   Final   - Negative for intraepithelial lesion or malignancy (NILM)   Review of Systems:  Pertinent items noted in HPI and remainder of comprehensive ROS otherwise negative.  Physical Exam:  BP 118/83   Pulse 78   Wt 205 lb 0.2 oz (93 kg)   BMI 36.32 kg/m  CONSTITUTIONAL: Well-developed, well-nourished female in no acute distress.  HEENT:  Normocephalic, atraumatic. External right and left ear normal. No scleral icterus.  NECK: Normal range of motion, supple, no masses noted on observation SKIN: No rash noted. Not diaphoretic. No erythema. No pallor. MUSCULOSKELETAL: Normal range of motion. No edema noted. NEUROLOGIC: Alert and oriented to person, place, and time. Normal muscle tone coordination. No cranial nerve deficit noted. PSYCHIATRIC: Normal mood and affect. Normal behavior. Normal judgment and thought  content.  CARDIOVASCULAR: Normal heart rate noted RESPIRATORY: Effort and breath sounds normal, no problems with respiration noted ABDOMEN: No masses noted. No other overt distention noted.    PELVIC: Deferred  Labs and Imaging Assessment and Plan:   1. Miscarriage - Discussed common causes of SAB. Reassured her this was nothing in her control.  - Pathology was normal.  - Discussed desires for future pregnancy - pt does but had depo in the hosptial and would like to continue for now.  - Discussed plan for subsequent pregnancy i.e. early [place] (typically around 7-8wks) - Answered all questions  Routine preventative health maintenance measures emphasized. Please refer to After Visit Summary for other counseling recommendations.   Return in about 12 weeks (around 06/05/2022) for Depo.  Milas Hock, MD, FACOG Obstetrician & Gynecologist, [facility] for [facility], [facility]

## 2022-03-12 ENCOUNTER — Encounter (HOSPITAL_COMMUNITY): Payer: Self-pay | Admitting: *Deleted

## 2022-03-12 ENCOUNTER — Ambulatory Visit (HOSPITAL_COMMUNITY)
Admission: EM | Admit: 2022-03-12 | Discharge: 2022-03-12 | Disposition: A | Discharge status: Home / Self Care | Payer: Medicaid Other | Attending: Family Medicine | Admitting: Family Medicine

## 2022-03-12 ENCOUNTER — Other Ambulatory Visit: Payer: Self-pay

## 2022-03-12 NOTE — ED Notes (Signed)
3 sutures removed from face next to RT eye brow

## 2022-03-12 NOTE — ED Triage Notes (Signed)
Pt presents today for removal of sutures located next to RT eye brow

## 2022-03-13 ENCOUNTER — Ambulatory Visit (INDEPENDENT_AMBULATORY_CARE_PROVIDER_SITE_OTHER): Payer: Medicaid Other | Admitting: Obstetrics and Gynecology

## 2022-03-13 ENCOUNTER — Encounter: Payer: Self-pay | Admitting: Obstetrics and Gynecology

## 2022-03-13 VITALS — BP 118/83 | HR 78 | Wt 205.0 lb

## 2022-03-13 DIAGNOSIS — O039 Complete or unspecified spontaneous abortion without complication: Secondary | ICD-10-CM

## 2022-03-31 DIAGNOSIS — Z419 Encounter for procedure for purposes other than remedying health state, unspecified: Secondary | ICD-10-CM | POA: Diagnosis not present

## 2022-04-08 ENCOUNTER — Other Ambulatory Visit: Payer: Medicaid Other

## 2022-04-08 ENCOUNTER — Ambulatory Visit: Payer: Medicaid Other

## 2022-04-09 ENCOUNTER — Ambulatory Visit (HOSPITAL_COMMUNITY)
Admission: EM | Admit: 2022-04-09 | Discharge: 2022-04-09 | Discharge status: Against Medical Advice | Payer: Medicaid Other

## 2022-04-09 NOTE — ED Triage Notes (Signed)
Patient called to room for the second time. No response.

## 2022-04-23 DIAGNOSIS — N760 Acute vaginitis: Secondary | ICD-10-CM | POA: Diagnosis not present

## 2022-04-23 DIAGNOSIS — Z114 Encounter for screening for human immunodeficiency virus [HIV]: Secondary | ICD-10-CM | POA: Diagnosis not present

## 2022-04-23 DIAGNOSIS — Z113 Encounter for screening for infections with a predominantly sexual mode of transmission: Secondary | ICD-10-CM | POA: Diagnosis not present

## 2022-05-01 DIAGNOSIS — Z419 Encounter for procedure for purposes other than remedying health state, unspecified: Secondary | ICD-10-CM | POA: Diagnosis not present

## 2022-05-31 DIAGNOSIS — Z419 Encounter for procedure for purposes other than remedying health state, unspecified: Secondary | ICD-10-CM | POA: Diagnosis not present

## 2022-06-05 ENCOUNTER — Ambulatory Visit: Payer: Medicaid Other

## 2022-07-01 DIAGNOSIS — Z419 Encounter for procedure for purposes other than remedying health state, unspecified: Secondary | ICD-10-CM | POA: Diagnosis not present

## 2022-08-01 DIAGNOSIS — Z419 Encounter for procedure for purposes other than remedying health state, unspecified: Secondary | ICD-10-CM | POA: Diagnosis not present

## 2022-08-02 ENCOUNTER — Ambulatory Visit (HOSPITAL_COMMUNITY)
Admission: EM | Admit: 2022-08-02 | Discharge: 2022-08-02 | Disposition: A | Discharge status: Home / Self Care | Payer: Medicaid Other | Attending: Emergency Medicine | Admitting: Emergency Medicine

## 2022-08-02 ENCOUNTER — Encounter (HOSPITAL_COMMUNITY): Payer: Self-pay | Admitting: Emergency Medicine

## 2022-08-02 DIAGNOSIS — J069 Acute upper respiratory infection, unspecified: Secondary | ICD-10-CM | POA: Diagnosis not present

## 2022-08-02 DIAGNOSIS — Z87891 Personal history of nicotine dependence: Secondary | ICD-10-CM | POA: Diagnosis not present

## 2022-08-02 DIAGNOSIS — Z1152 Encounter for screening for COVID-19: Secondary | ICD-10-CM | POA: Insufficient documentation

## 2022-08-02 DIAGNOSIS — R051 Acute cough: Secondary | ICD-10-CM | POA: Diagnosis not present

## 2022-08-02 DIAGNOSIS — J029 Acute pharyngitis, unspecified: Secondary | ICD-10-CM | POA: Diagnosis present

## 2022-08-02 LAB — POC INFLUENZA A AND B ANTIGEN (URGENT CARE ONLY)
INFLUENZA A ANTIGEN, POC: NEGATIVE
INFLUENZA B ANTIGEN, POC: NEGATIVE

## 2022-08-02 MED ORDER — PROMETHAZINE-DM 6.25-15 MG/5ML PO SYRP: 5.0000 mL | ORAL_SOLUTION | Freq: Two times a day (BID) | ORAL | 0 refills | Status: AC | PRN

## 2022-08-02 NOTE — Discharge Instructions (Addendum)
You appear to have a viral illness.  Your flu test was negative in clinic.  Your COVID test is pending.  We will call if your results are positive.  I have written you out of work until Monday, and our staff can adjust this if your results are positive. Please refer to your MyChart if you do not give a call as we do not call negative results.  Please continue symptomatic management at home with Tylenol, ibuprofen, Mucinex, warm tea and honey.  You can take the promethazine cough syrup up to twice a day.  Be careful as it can be sedating so do not drink or drive on this medication.  Please follow-up as needed with your primary care provider as needed or if symptoms do not improve over the next week.  Please seek immediate medical care if you develop chest pain, shortness of breath, fever that does not respond to medication.

## 2022-08-02 NOTE — ED Provider Notes (Signed)
[place]    CSN: [id number] Arrival date & time: 08/02/22  1035      History   Chief Complaint Chief Complaint  Patient presents with   Cough   Nasal Congestion    HPI Molly Hensley is a 27 y.o. female.   Pleasant 27 year old female who presents to clinic with cough, sneezing, hot and cold chills, and sore throat.  She started to feel unwell Wednesday night.  Has been taking Mucinex with little relief.  Did not get her flu shot this season but is vaccinated and boosted against COVID-19.  Reports sore throat and burning with cough.  Denies chest pain, shortness of breath, wheezing, nausea, vomiting, diarrhea.  The history is provided by the patient.  Cough Cough characteristics:  Non-productive Severity:  Moderate Duration:  3 days Chronicity:  New Smoker: yes   Context: occupational exposure   Relieved by:  Nothing Ineffective treatments:  Decongestant Associated symptoms: fever, headaches and sore throat     Past Medical History:  Diagnosis Date   Asthma    Childhood   Chlamydia    Headache     Patient Active Problem List   Diagnosis Date Noted   Acute on chronic anemia 07/21/2020   Tobacco use disorder 03/08/2014    Past Surgical History:  Procedure Laterality Date   DILATION AND EVACUATION N/A 02/12/2022   Procedure: DILATATION AND EVACUATION;  Surgeon: Janyth Pupa, DO;  Location: [place];  Service: Gynecology;  Laterality: N/A;   INDUCED ABORTION     TOOTH EXTRACTION      OB History     Gravida  3   Para  1   Term  1   Preterm      AB  1   Living  1      SAB      IAB  1   Ectopic      Multiple  0   Live Births  1            Home Medications    Prior to Admission medications   Medication Sig Start Date End Date Taking? Authorizing Provider  promethazine-dextromethorphan (PROMETHAZINE-DM) 6.25-15 MG/5ML syrup Take 5 mLs by mouth 2 (two) times daily as needed for up to 5 days for cough. 08/02/22 08/07/22 Yes  Louretta Shorten, [place], FNP  ibuprofen (ADVIL) 600 MG tablet Take 1 tablet (600 mg total) by mouth every 6 (six) hours as needed. 02/26/22   Talbot Grumbling, FNP    Family History Family History  Problem Relation Age of Onset   Hypertension Mother    Epilepsy Mother    Heart disease Mother    Cancer Maternal Grandmother     Social History Social History   Tobacco Use   Smoking status: Former    Packs/day: 0.25    Types: Cigarettes    Quit date: 01/01/2022    Years since quitting: 0.5   Smokeless tobacco: Never   Tobacco comments:    cutting down  Vaping Use   Vaping Use: Never used  Substance Use Topics   Alcohol use: No    Alcohol/week: 0.0 standard drinks of alcohol   Drug use: Not Currently    Types: Marijuana    Comment: Last smoked August 2023     Allergies   Patient has no known allergies.   Review of Systems Review of Systems  Constitutional:  Positive for fever.  HENT:  Positive for congestion, sneezing and sore throat.   Respiratory:  Positive for cough.   Neurological:  Positive for headaches.     Physical Exam Triage Vital Signs ED Triage Vitals [08/02/22 1237]  Enc Vitals Group     BP 100/68     Pulse Rate 100     Resp 16     Temp 98.8 F (37.1 C)     Temp Source Oral     SpO2 96 %     Weight      Height      Head Circumference      Peak Flow      Pain Score      Pain Loc      Pain Edu?      Excl. in [place]?    No data found.  Updated Vital Signs BP 100/68 (BP Location: Left Arm)   Pulse 100   Temp 98.8 F (37.1 C) (Oral)   Resp 16   SpO2 96%   Visual Acuity Right Eye Distance:   Left Eye Distance:   Bilateral Distance:    Right Eye Near:   Left Eye Near:    Bilateral Near:     Physical Exam Constitutional:      General: She is not in acute distress.    Appearance: Normal appearance. She is not ill-appearing.     Comments: Pleasant 27 year old female who appears stated age.  HENT:     Head: Normocephalic and  atraumatic.     Salivary Glands: Right salivary gland is not tender. Left salivary gland is not tender.     Right Ear: Tympanic membrane normal.     Left Ear: Tympanic membrane normal.     Nose: Nose normal.     Mouth/Throat:     Mouth: Mucous membranes are moist.     Pharynx: Posterior oropharyngeal erythema present.  Cardiovascular:     Rate and Rhythm: Normal rate and regular rhythm.     Pulses: Normal pulses.     Heart sounds: Normal heart sounds, S1 normal and S2 normal.  Pulmonary:     Effort: Pulmonary effort is normal. No respiratory distress.     Breath sounds: Normal breath sounds. No wheezing.     Comments: Lungs vesicular posteriorly Musculoskeletal:        General: Normal range of motion.  Lymphadenopathy:     Head:     Right side of head: No submandibular adenopathy.     Left side of head: No submandibular adenopathy.     Cervical: No cervical adenopathy.  Skin:    General: Skin is warm and dry.  Neurological:     Mental Status: She is alert and oriented to person, place, and time.  Psychiatric:        Mood and Affect: Mood normal.        Speech: Speech normal.        Behavior: Behavior is cooperative.      UC Treatments / Results  Labs (all labs ordered are listed, but only abnormal results are displayed) Labs Reviewed  SARS CORONAVIRUS 2 (TAT 6-24 HRS)  POC INFLUENZA A AND B ANTIGEN (URGENT CARE ONLY)    EKG   Radiology No results found.  Procedures Procedures (including critical care time)  Medications Ordered in UC Medications - No data to display  Initial Impression / Assessment and Plan / UC Course  I have reviewed the triage vital signs and the nursing notes.  Pertinent labs & imaging results that were available during my care of the patient were reviewed  by me and considered in my medical decision making (see chart for details).  Discussed due to symptom duration and presentation that this is statistically viral.  Testing for flu in  clinic.  Will send off for COVID-19 testing and call if positive.  Discussed symptomatic management with Tylenol, NSAIDs, mucinex, tea with warm honey. Cough syrup PRN. Discussed return precautions and follow-up care.      Final Clinical Impressions(s) / UC Diagnoses   Final diagnoses:  Viral upper respiratory infection  Acute cough     Discharge Instructions      You appear to have a viral illness.  Your flu test was negative in clinic.  Your COVID test is pending.  We will call if your results are positive.  I have written you out of work until Monday, and our staff can adjust this if your results are positive. Please refer to your MyChart if you do not give a call as we do not call negative results.  Please continue symptomatic management at home with Tylenol, ibuprofen, Mucinex, warm tea and honey.  You can take the promethazine cough syrup up to twice a day.  Be careful as it can be sedating so do not drink or drive on this medication.  Please follow-up as needed with your primary care provider as needed or if symptoms do not improve over the next week.  Please seek immediate medical care if you develop chest pain, shortness of breath, fever that does not respond to medication.     ED Prescriptions     Medication Sig Dispense Auth. Provider   promethazine-dextromethorphan (PROMETHAZINE-DM) 6.25-15 MG/5ML syrup Take 5 mLs by mouth 2 (two) times daily as needed for up to 5 days for cough. 50 mL Terri Malerba, [place], [place]      I have reviewed the PDMP during this encounter.   Lilith Solana, [place], [place] 08/02/22 1346

## 2022-08-02 NOTE — ED Triage Notes (Signed)
Pt started new job working at [employer] and around lots of sick people. Reports since Wed having cough and congestion. Took Mucinex.

## 2022-08-03 LAB — SARS CORONAVIRUS 2 (TAT 6-24 HRS): SARS Coronavirus 2: NEGATIVE

## 2022-08-30 DIAGNOSIS — Z419 Encounter for procedure for purposes other than remedying health state, unspecified: Secondary | ICD-10-CM | POA: Diagnosis not present

## 2022-09-30 DIAGNOSIS — Z419 Encounter for procedure for purposes other than remedying health state, unspecified: Secondary | ICD-10-CM | POA: Diagnosis not present

## 2022-10-06 IMAGING — US US PELVIS COMPLETE
1 series · 14 of 25 positions shown · non-contrast
Comparison: None

CLINICAL DATA: IUD placement, pain

EXAM:
TRANSABDOMINAL AND TRANSVAGINAL ULTRASOUND OF PELVIS
TECHNIQUE: Both transabdominal and transvaginal ultrasound examinations of the
pelvis were performed. Transabdominal technique was performed for
global imaging of the pelvis including uterus, ovaries, adnexal
regions, and pelvic cul-de-sac. It was necessary to proceed with
endovaginal exam following the transabdominal exam to visualize the
bilateral ovaries.

[Series 1: us pelvis (transabdominal only) · 32 acquisitions, 14 frames shown]
[im 1/32]
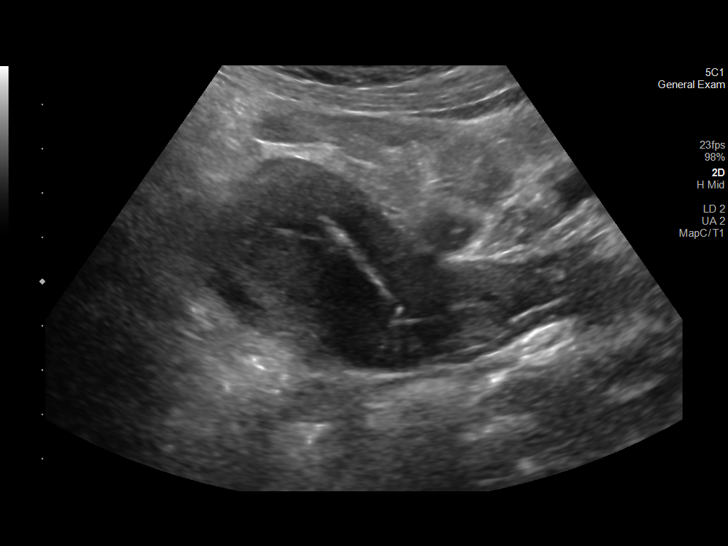
[im 3/32]
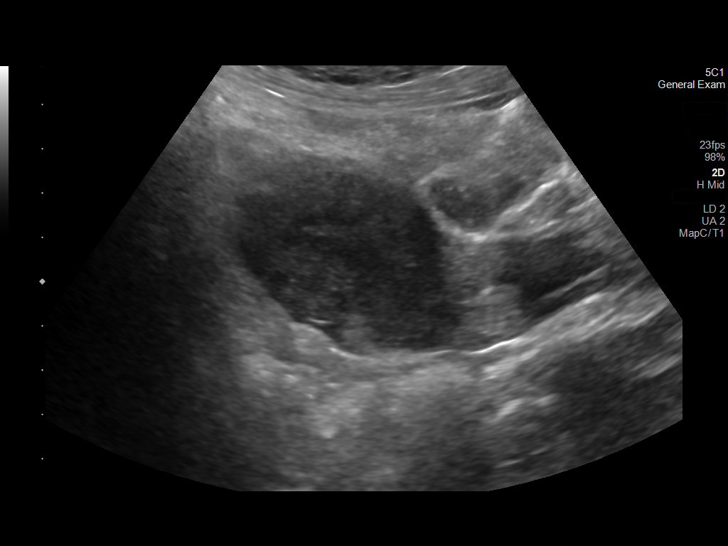
[im 6/32]
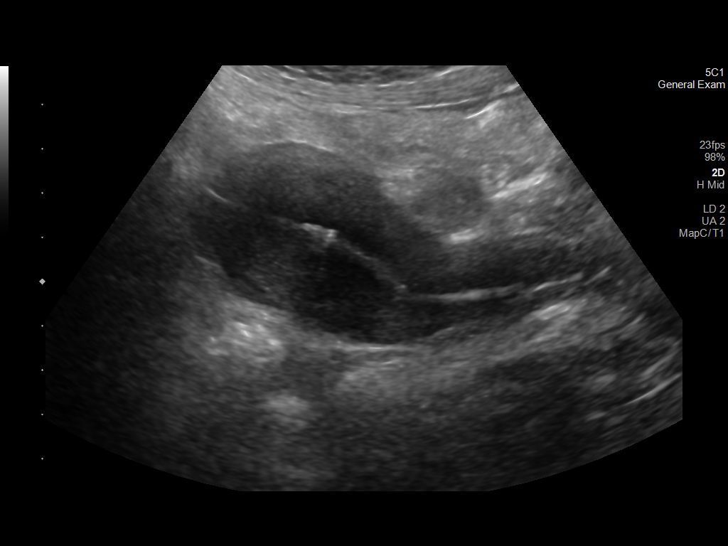
[im 8/32]
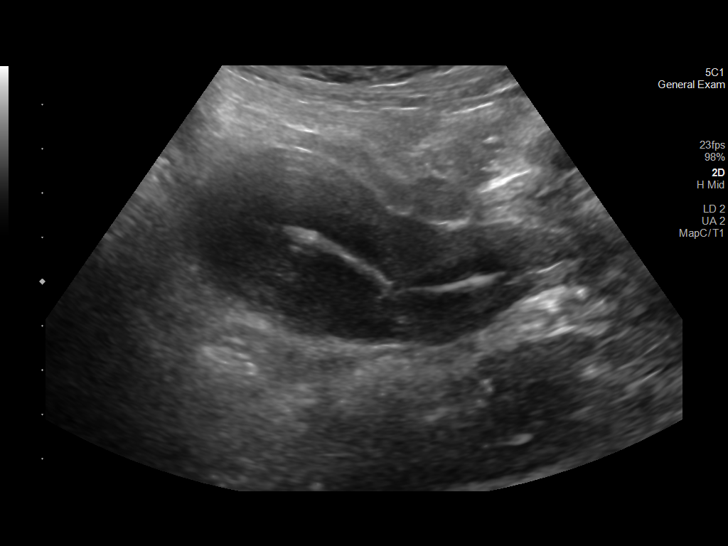
[im 11/32]
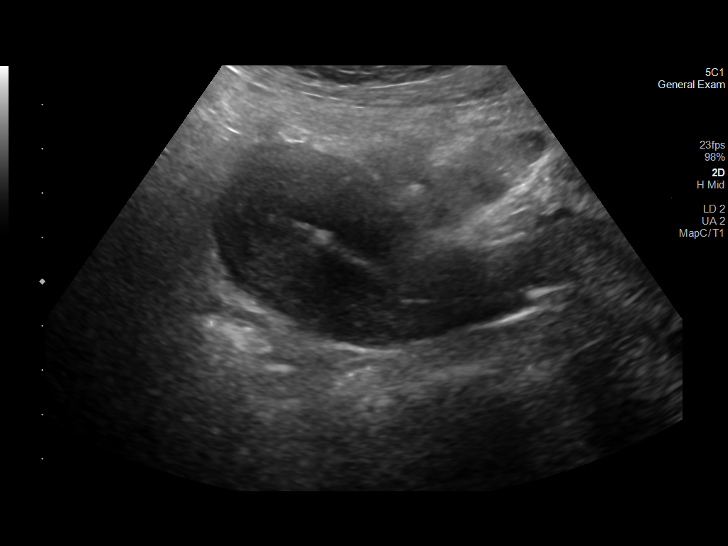
[im 12/32]
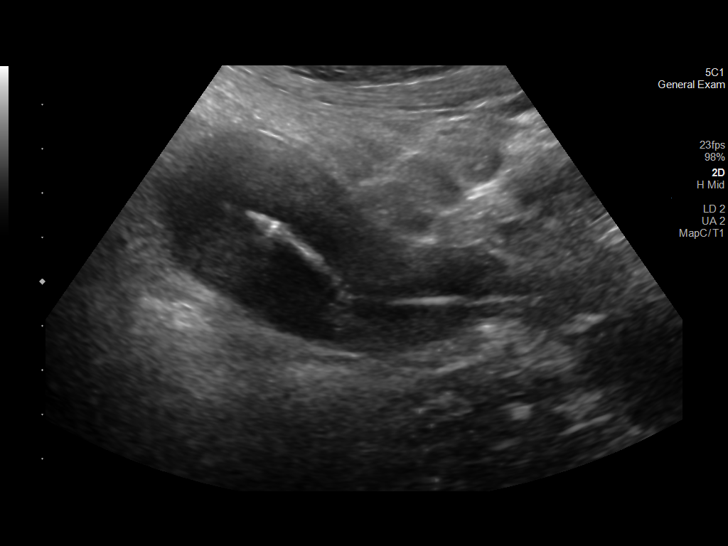
[im 15/32]
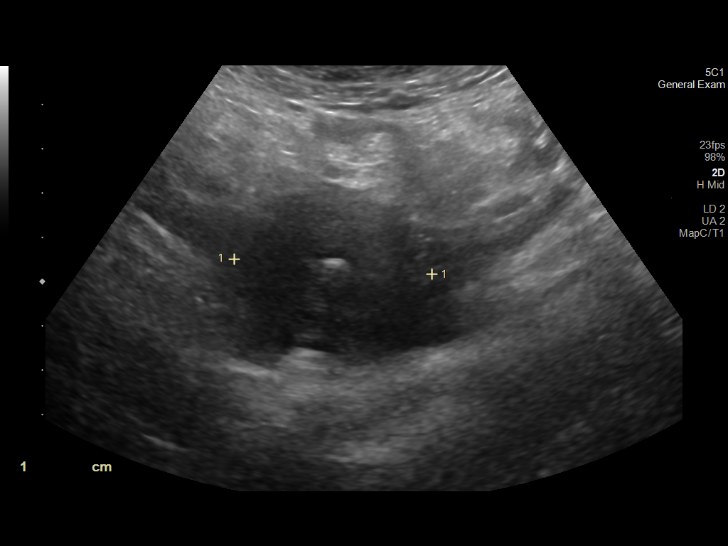
[im 17/32]
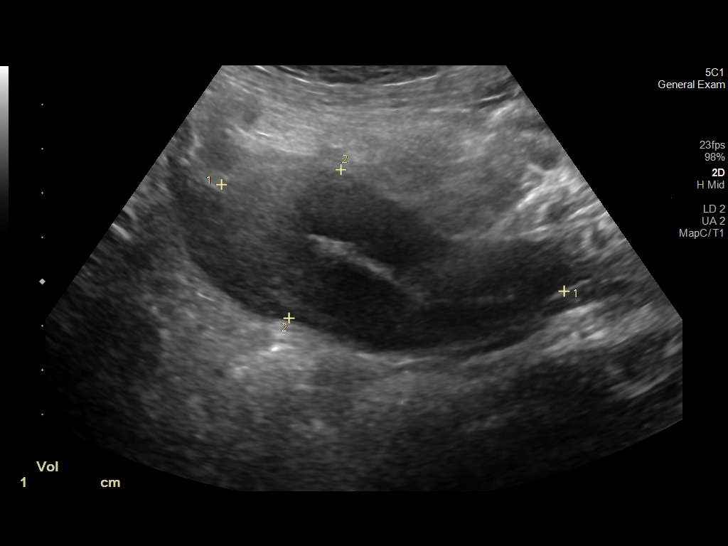
[im 20/32]
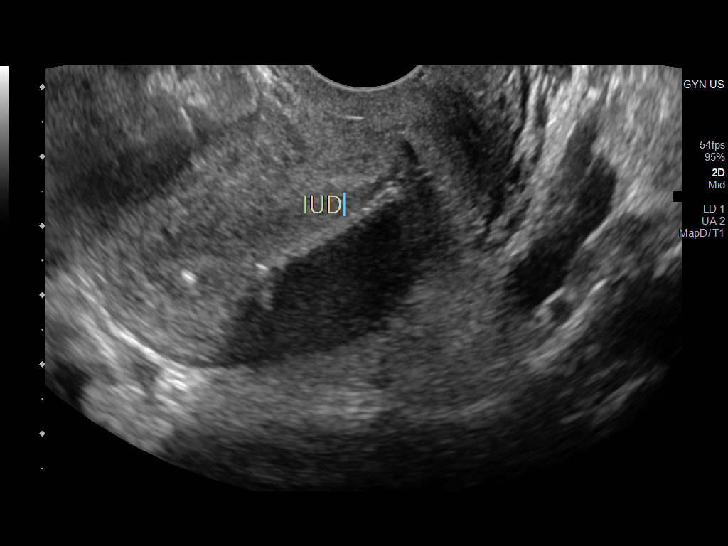
[im 21/32]
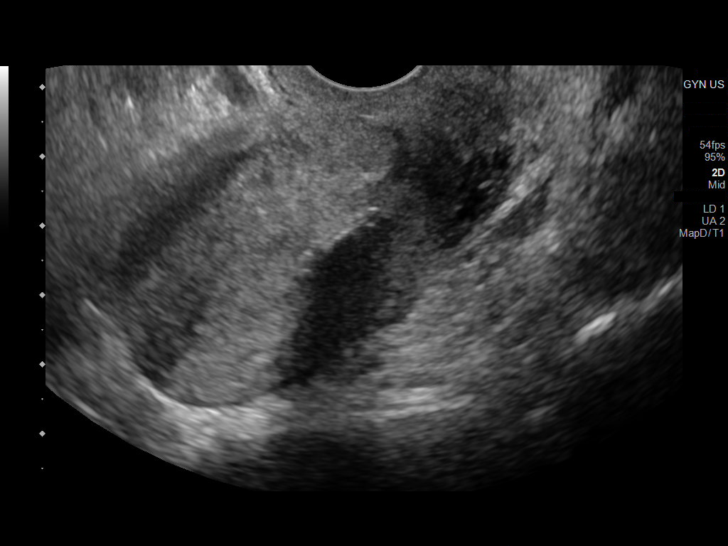
[im 24/32]
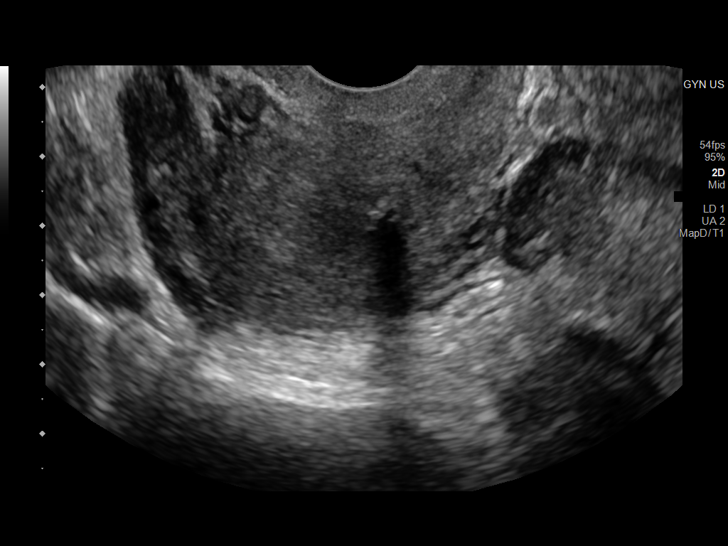
[im 26/32]
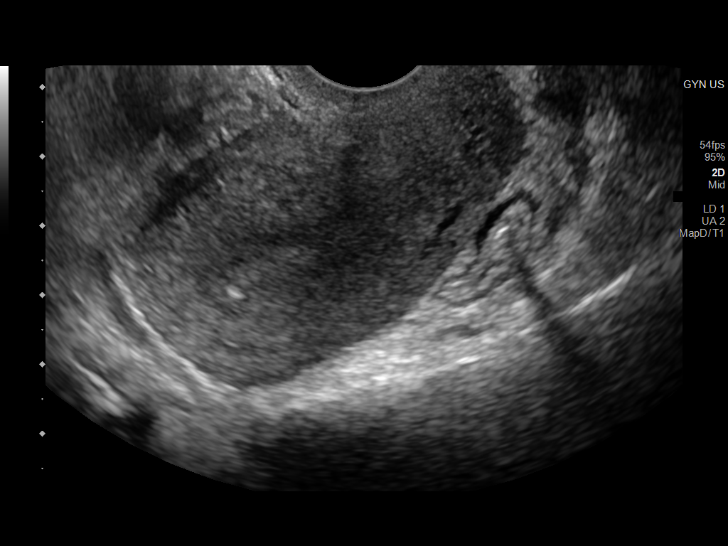
[im 29/32]
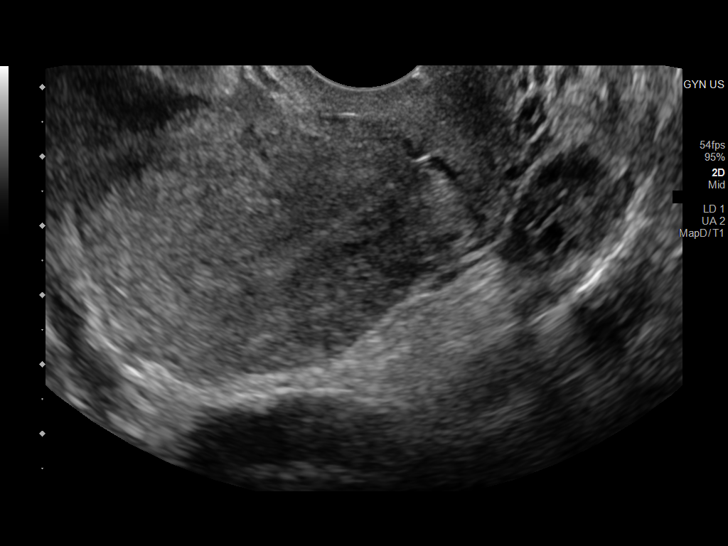
[im 32/32]
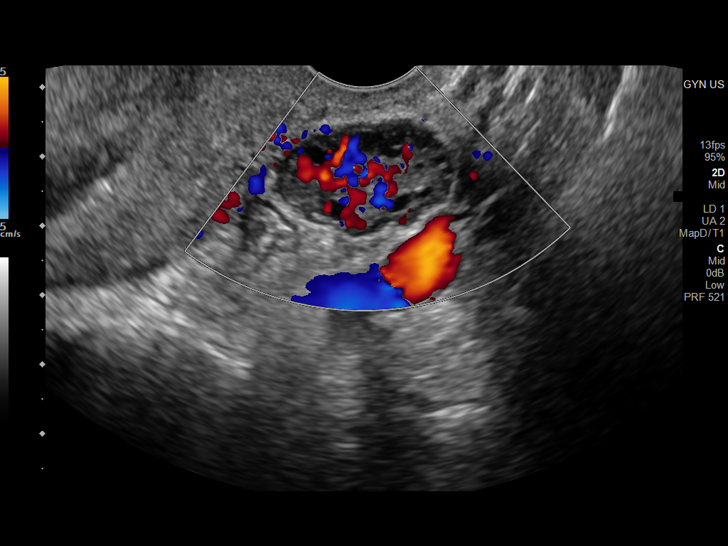

[14 of 25 positions shown; findings below may reference images not displayed]

FINDINGS: Uterus

Measurements: 8.1 x 3.6 x 4.5 cm = volume: 68.6 mL. No fibroids or
other mass visualized.

Endometrium

Thickness: 4 mm. IUD in the lower uterine body and appears to be
embedded within the myometrium (image 3).

Right ovary

Normal appearance/no adnexal mass.

Left ovary

Normal appearance/no adnexal mass.

Other findings

No abnormal free fluid.
IMPRESSION: IUD in the lower uterine body and appears to be embedded within the
myometrium.

## 2022-10-30 DIAGNOSIS — Z419 Encounter for procedure for purposes other than remedying health state, unspecified: Secondary | ICD-10-CM | POA: Diagnosis not present

## 2022-11-30 DIAGNOSIS — Z419 Encounter for procedure for purposes other than remedying health state, unspecified: Secondary | ICD-10-CM | POA: Diagnosis not present

## 2022-12-30 DIAGNOSIS — Z419 Encounter for procedure for purposes other than remedying health state, unspecified: Secondary | ICD-10-CM | POA: Diagnosis not present

## 2023-01-30 DIAGNOSIS — Z419 Encounter for procedure for purposes other than remedying health state, unspecified: Secondary | ICD-10-CM | POA: Diagnosis not present

## 2023-03-02 DIAGNOSIS — Z419 Encounter for procedure for purposes other than remedying health state, unspecified: Secondary | ICD-10-CM | POA: Diagnosis not present

## 2023-04-01 DIAGNOSIS — Z419 Encounter for procedure for purposes other than remedying health state, unspecified: Secondary | ICD-10-CM | POA: Diagnosis not present

## 2023-04-12 ENCOUNTER — Ambulatory Visit (HOSPITAL_COMMUNITY)
Admission: EM | Admit: 2023-04-12 | Discharge: 2023-04-12 | Disposition: A | Discharge status: Home / Self Care | Payer: Medicaid Other | Attending: Emergency Medicine | Admitting: Emergency Medicine

## 2023-04-12 ENCOUNTER — Encounter (HOSPITAL_COMMUNITY): Payer: Self-pay

## 2023-04-12 DIAGNOSIS — B9789 Other viral agents as the cause of diseases classified elsewhere: Secondary | ICD-10-CM | POA: Insufficient documentation

## 2023-04-12 DIAGNOSIS — Z1152 Encounter for screening for COVID-19: Secondary | ICD-10-CM | POA: Diagnosis not present

## 2023-04-12 DIAGNOSIS — J988 Other specified respiratory disorders: Secondary | ICD-10-CM | POA: Insufficient documentation

## 2023-04-12 NOTE — ED Provider Notes (Signed)
[facility]    CSN: [id number] Arrival date & time: 04/12/23  1100      History   Chief Complaint Chief Complaint  Patient presents with   Cough   Sore Throat    HPI Molly Hensley is a 27 y.o. female.   Patient presents for cough and sore throat x 2 days.  Patient reports using Vicks nighttime and cold syrup with minimal relief.  Patient requesting COVID testing.  Denies abdominal pain, shortness of breath, fever, fatigue, headache, nausea, vomiting, and diarrhea.   Cough Associated symptoms: rhinorrhea and sore throat   Associated symptoms: no chest pain, no chills, no fever, no headaches and no shortness of breath   Sore Throat Pertinent negatives include no chest pain, no abdominal pain, no headaches and no shortness of breath.    Past Medical History:  Diagnosis Date   Asthma    Childhood   Chlamydia    Headache     Patient Active Problem List   Diagnosis Date Noted   Acute on chronic anemia 07/21/2020   Tobacco use disorder 03/08/2014    Past Surgical History:  Procedure Laterality Date   DILATION AND EVACUATION N/A 02/12/2022   Procedure: DILATATION AND EVACUATION;  Surgeon: Myna Hidalgo, DO;  Location: [facility];  Service: Gynecology;  Laterality: N/A;   INDUCED ABORTION     TOOTH EXTRACTION      OB History     Gravida  3   Para  1   Term  1   Preterm      AB  1   Living  1      SAB      IAB  1   Ectopic      Multiple  0   Live Births  1            Home Medications    Prior to Admission medications   Not on File    Family History Family History  Problem Relation Age of Onset   Hypertension Mother    Epilepsy Mother    Heart disease Mother    Cancer Maternal Grandmother     Social History Social History   Tobacco Use   Smoking status: Former    Current packs/day: 0.00    Types: Cigarettes    Quit date: 01/01/2022    Years since quitting: 1.2   Smokeless tobacco: Never   Tobacco comments:     cutting down  Vaping Use   Vaping status: Never Used  Substance Use Topics   Alcohol use: No    Alcohol/week: 0.0 standard drinks of alcohol   Drug use: Not Currently    Types: Marijuana    Comment: Last smoked August 2023     Allergies   Patient has no known allergies.   Review of Systems Review of Systems  Constitutional:  Negative for chills, fatigue and fever.  HENT:  Positive for congestion, rhinorrhea and sore throat. Negative for trouble swallowing.   Respiratory:  Positive for cough. Negative for shortness of breath.   Cardiovascular:  Negative for chest pain.  Gastrointestinal:  Negative for abdominal pain, diarrhea, nausea and vomiting.  Neurological:  Negative for weakness and headaches.     Physical Exam Triage Vital Signs ED Triage Vitals  Encounter Vitals Group     BP 04/12/23 1204 102/69     Systolic BP Percentile --      Diastolic BP Percentile --      Pulse Rate 04/12/23  1204 89     Resp 04/12/23 1204 18     Temp 04/12/23 1204 98.4 F (36.9 C)     Temp Source 04/12/23 1204 Oral     SpO2 04/12/23 1204 95 %     Weight 04/12/23 1204 205 lb 9.6 oz (93.3 kg)     Height 04/12/23 1204 5\' 3"  (1.6 m)     Head Circumference --      Peak Flow --      Pain Score 04/12/23 1203 7     Pain Loc --      Pain Education --      Exclude from Growth Chart --    No data found.  Updated Vital Signs BP 102/69 (BP Location: Right Arm)   Pulse 89   Temp 98.4 F (36.9 C) (Oral)   Resp 18   Ht 5\' 3"  (1.6 m)   Wt 205 lb 9.6 oz (93.3 kg)   LMP 04/01/2023 (Approximate)   SpO2 95%   BMI 36.42 kg/m   Visual Acuity Right Eye Distance:   Left Eye Distance:   Bilateral Distance:    Right Eye Near:   Left Eye Near:    Bilateral Near:     Physical Exam Vitals and nursing note reviewed.  Constitutional:      General: She is awake. She is not in acute distress.    Appearance: Normal appearance. She is well-developed and well-groomed. She is not ill-appearing,  toxic-appearing or diaphoretic.  HENT:     Right Ear: Tympanic membrane and ear canal normal.     Left Ear: Tympanic membrane and ear canal normal.     Nose: Congestion and rhinorrhea present.     Mouth/Throat:     Mouth: Mucous membranes are moist.     Pharynx: Posterior oropharyngeal erythema present. No pharyngeal swelling.     Tonsils: No tonsillar exudate.  Cardiovascular:     Rate and Rhythm: Normal rate.     Heart sounds: Normal heart sounds.  Pulmonary:     Effort: Pulmonary effort is normal.     Breath sounds: Normal breath sounds.  Neurological:     Mental Status: She is alert.  Psychiatric:        Behavior: Behavior is cooperative.      UC Treatments / Results  Labs (all labs ordered are listed, but only abnormal results are displayed) Labs Reviewed  SARS CORONAVIRUS 2 (TAT 6-24 HRS)    EKG   Radiology No results found.  Procedures Procedures (including critical care time)  Medications Ordered in UC Medications - No data to display  Initial Impression / Assessment and Plan / UC Course  I have reviewed the triage vital signs and the nursing notes.  Pertinent labs & imaging results that were available during my care of the patient were reviewed by me and considered in my medical decision making (see chart for details).     Patient presented with 2-day history of cough and sore throat.  Patient requesting COVID testing.  Upon assessment mild erythema noted to oropharynx, congestion and rhinorrhea present.  COVID testing ordered.  Recommended over-the-counter symptom management.  Discussed follow-up, return, ER precautions. Final Clinical Impressions(s) / UC Diagnoses   Final diagnoses:  Viral respiratory illness     Discharge Instructions      You can continue taking over-the-counter medication for symptoms.  Alternate between Tylenol and ibuprofen as needed for pain and fever.  Otherwise get plenty of rest and stay hydrated.  Return  here for  symptoms persist.  If you develop trouble breathing or high fevers unrelieved by medication please seek immediate medical treatment in the ER.    ED Prescriptions   None    PDMP not reviewed this encounter.   Wynonia Lawman A, NP 04/12/23 [phone]

## 2023-04-12 NOTE — ED Triage Notes (Signed)
Cough and sore throat for 2 days. States her mother was sick first but was not tested. Patient and daughter are now sick. Having hot flashes.   Patient tried Vic's night time cold syrup with little relief.

## 2023-04-12 NOTE — Discharge Instructions (Signed)
You can continue taking over-the-counter medication for symptoms.  Alternate between Tylenol and ibuprofen as needed for pain and fever.  Otherwise get plenty of rest and stay hydrated.  Return here for symptoms persist.  If you develop trouble breathing or high fevers unrelieved by medication please seek immediate medical treatment in the ER.

## 2023-04-13 LAB — SARS CORONAVIRUS 2 (TAT 6-24 HRS): SARS Coronavirus 2: NEGATIVE

## 2023-05-02 DIAGNOSIS — Z419 Encounter for procedure for purposes other than remedying health state, unspecified: Secondary | ICD-10-CM | POA: Diagnosis not present

## 2023-05-15 ENCOUNTER — Encounter (HOSPITAL_COMMUNITY): Payer: Self-pay | Admitting: Emergency Medicine

## 2023-05-15 ENCOUNTER — Other Ambulatory Visit: Payer: Self-pay

## 2023-05-15 ENCOUNTER — Ambulatory Visit (HOSPITAL_COMMUNITY)
Admission: EM | Admit: 2023-05-15 | Discharge: 2023-05-15 | Disposition: A | Discharge status: Home / Self Care | Payer: Medicaid Other | Attending: Internal Medicine | Admitting: Internal Medicine

## 2023-05-15 DIAGNOSIS — J040 Acute laryngitis: Secondary | ICD-10-CM | POA: Diagnosis not present

## 2023-05-15 LAB — POC COVID19/FLU A&B COMBO
Covid Antigen, POC: NEGATIVE
Influenza A Antigen, POC: NEGATIVE
Influenza B Antigen, POC: NEGATIVE

## 2023-05-15 MED ORDER — FLUTICASONE PROPIONATE 50 MCG/ACT NA SUSP: 1.0000 | Freq: Every day | NASAL | 0 refills | Status: DC

## 2023-05-15 MED ORDER — BENZONATATE 200 MG PO CAPS: 200.0000 mg | ORAL_CAPSULE | Freq: Three times a day (TID) | ORAL | 0 refills | Status: DC

## 2023-05-15 NOTE — ED Triage Notes (Signed)
Patient c/o sore throat, hoarseness, cough and sneezes x 2 days. States her daughter is also sick. No known fevers. No known sick contacts.

## 2023-05-15 NOTE — Discharge Instructions (Addendum)
Please maintain adequate hydration Take medications as prescribed Voice rest will help with hoarseness of voice Humidifier and VapoRub use at bedtime Your COVID and flu test were negative Please feel free to return to urgent care if you have worsening symptoms.

## 2023-05-15 NOTE — ED Provider Notes (Signed)
[facility]    CSN: [id number] Arrival date & time: 05/15/23  7829      History   Chief Complaint No chief complaint on file.   HPI Molly Hensley is a 27 y.o. female comes to the urgent care with 2-day history of nasal congestion, postnasal drainage, cough and hoarseness of voice.  Patient's symptoms started 2 days ago and has been persistent.  Patient's child had similar symptoms last week and is currently being treated for an ear infection.  Patient denies any shortness of breath or wheezing.  Cough is productive of yellowish sputum in the morning.  She denies any shortness of breath.  She has some central chest pain with cough.  No fever or chills.  No nausea or vomiting.Marland Kitchen   HPI  Past Medical History:  Diagnosis Date   Asthma    Childhood   Chlamydia    Headache     Patient Active Problem List   Diagnosis Date Noted   Acute on chronic anemia 07/21/2020   Tobacco use disorder 03/08/2014    Past Surgical History:  Procedure Laterality Date   DILATION AND EVACUATION N/A 02/12/2022   Procedure: DILATATION AND EVACUATION;  Surgeon: Myna Hidalgo, DO;  Location: [facility];  Service: Gynecology;  Laterality: N/A;   INDUCED ABORTION     TOOTH EXTRACTION      OB History     Gravida  3   Para  1   Term  1   Preterm      AB  1   Living  1      SAB      IAB  1   Ectopic      Multiple  0   Live Births  1            Home Medications    Prior to Admission medications   Medication Sig Start Date End Date Taking? Authorizing Provider  benzonatate (TESSALON) 200 MG capsule Take 1 capsule (200 mg total) by mouth every 8 (eight) hours. 05/15/23  Yes Octavius Shin, Britta Mccreedy, MD  fluticasone (FLONASE) 50 MCG/ACT nasal spray Place 1 spray into both nostrils daily. 05/15/23  Yes Shakya Sebring, Britta Mccreedy, MD    Family History Family History  Problem Relation Age of Onset   Hypertension Mother    Epilepsy Mother    Heart disease Mother    Cancer Maternal  Grandmother     Social History Social History   Tobacco Use   Smoking status: Former    Current packs/day: 0.00    Types: Cigarettes    Quit date: 01/01/2022    Years since quitting: 1.3   Smokeless tobacco: Never   Tobacco comments:    cutting down  Vaping Use   Vaping status: Never Used  Substance Use Topics   Alcohol use: No    Alcohol/week: 0.0 standard drinks of alcohol   Drug use: Not Currently    Types: Marijuana    Comment: Last smoked August 2023     Allergies   Patient has no known allergies.   Review of Systems Review of Systems As per HPI  Physical Exam Triage Vital Signs ED Triage Vitals  Encounter Vitals Group     BP 05/15/23 0848 121/80     Systolic BP Percentile --      Diastolic BP Percentile --      Pulse Rate 05/15/23 0848 (!) 102     Resp 05/15/23 0848 15     Temp 05/15/23  0848 99.3 F (37.4 C)     Temp Source 05/15/23 0848 Oral     SpO2 05/15/23 0848 95 %     Weight --      Height --      Head Circumference --      Peak Flow --      Pain Score 05/15/23 0847 8     Pain Loc --      Pain Education --      Exclude from Growth Chart --    No data found.  Updated Vital Signs BP 121/80 (BP Location: Left Arm)   Pulse (!) 102   Temp 99.3 F (37.4 C) (Oral)   Resp 15   SpO2 95%   Visual Acuity Right Eye Distance:   Left Eye Distance:   Bilateral Distance:    Right Eye Near:   Left Eye Near:    Bilateral Near:     Physical Exam Vitals and nursing note reviewed.  Constitutional:      General: She is not in acute distress.    Appearance: She is not ill-appearing.  HENT:     Right Ear: Tympanic membrane normal.     Left Ear: Tympanic membrane normal.     Nose: Congestion present.     Mouth/Throat:     Mouth: Mucous membranes are moist.     Pharynx: No posterior oropharyngeal erythema.  Eyes:     Extraocular Movements: Extraocular movements intact.     Pupils: Pupils are equal, round, and reactive to light.   Cardiovascular:     Rate and Rhythm: Normal rate and regular rhythm.     Pulses: Normal pulses.     Heart sounds: Normal heart sounds.  Pulmonary:     Effort: Pulmonary effort is normal.     Breath sounds: Normal breath sounds.  Abdominal:     General: Bowel sounds are normal.     Palpations: Abdomen is soft.  Musculoskeletal:     Cervical back: Normal range of motion. No rigidity or tenderness.  Lymphadenopathy:     Cervical: No cervical adenopathy.  Skin:    Capillary Refill: Capillary refill takes less than 2 seconds.  Neurological:     General: No focal deficit present.     Mental Status: She is alert and oriented to person, place, and time.      UC Treatments / Results  Labs (all labs ordered are listed, but only abnormal results are displayed) Labs Reviewed  POC COVID19/FLU A&B COMBO    EKG   Radiology No results found.  Procedures Procedures (including critical care time)  Medications Ordered in UC Medications - No data to display  Initial Impression / Assessment and Plan / UC Course  I have reviewed the triage vital signs and the nursing notes.  Pertinent labs & imaging results that were available during my care of the patient were reviewed by me and considered in my medical decision making (see chart for details).     1.  Acute viral laryngitis: POC COVID/flu was negative Warm salt water gargle Humidifier use will help with hoarseness of voice Voice rest recommended Tylenol or ibuprofen as needed for pain and/or fever Return precautions given Final Clinical Impressions(s) / UC Diagnoses   Final diagnoses:  Acute viral laryngitis     Discharge Instructions      Please maintain adequate hydration Take medications as prescribed Voice rest will help with hoarseness of voice Humidifier and VapoRub use at bedtime Your COVID and flu  test were negative Please feel free to return to urgent care if you have worsening symptoms.     ED  Prescriptions     Medication Sig Dispense Auth. Provider   benzonatate (TESSALON) 200 MG capsule Take 1 capsule (200 mg total) by mouth every 8 (eight) hours. 21 capsule Caridad Silveira, Britta Mccreedy, MD   fluticasone (FLONASE) 50 MCG/ACT nasal spray Place 1 spray into both nostrils daily. 16 g Juandedios Dudash, Britta Mccreedy, MD      PDMP not reviewed this encounter.   Merrilee Jansky, MD 05/15/23 [phone]

## 2023-06-01 DIAGNOSIS — Z419 Encounter for procedure for purposes other than remedying health state, unspecified: Secondary | ICD-10-CM | POA: Diagnosis not present

## 2023-07-02 DIAGNOSIS — Z419 Encounter for procedure for purposes other than remedying health state, unspecified: Secondary | ICD-10-CM | POA: Diagnosis not present

## 2023-08-02 DIAGNOSIS — Z419 Encounter for procedure for purposes other than remedying health state, unspecified: Secondary | ICD-10-CM | POA: Diagnosis not present

## 2023-08-30 DIAGNOSIS — Z419 Encounter for procedure for purposes other than remedying health state, unspecified: Secondary | ICD-10-CM | POA: Diagnosis not present

## 2023-09-09 ENCOUNTER — Ambulatory Visit (HOSPITAL_COMMUNITY): Admission: EM | Admit: 2023-09-09 | Discharge: 2023-09-09 | Disposition: A | Discharge status: Home / Self Care

## 2023-09-09 ENCOUNTER — Encounter (HOSPITAL_COMMUNITY): Payer: Self-pay

## 2023-09-09 ENCOUNTER — Ambulatory Visit (HOSPITAL_COMMUNITY)

## 2023-09-09 ENCOUNTER — Ambulatory Visit (INDEPENDENT_AMBULATORY_CARE_PROVIDER_SITE_OTHER)

## 2023-09-09 DIAGNOSIS — M7989 Other specified soft tissue disorders: Secondary | ICD-10-CM | POA: Diagnosis not present

## 2023-09-09 DIAGNOSIS — S93602A Unspecified sprain of left foot, initial encounter: Secondary | ICD-10-CM | POA: Diagnosis not present

## 2023-09-09 DIAGNOSIS — M79672 Pain in left foot: Secondary | ICD-10-CM | POA: Diagnosis not present

## 2023-09-09 DIAGNOSIS — M2012 Hallux valgus (acquired), left foot: Secondary | ICD-10-CM | POA: Diagnosis not present

## 2023-09-09 MED ORDER — IBUPROFEN 600 MG PO TABS: 600.0000 mg | ORAL_TABLET | Freq: Four times a day (QID) | ORAL | 0 refills | Status: DC | PRN
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
## 2023-09-09 NOTE — Discharge Instructions (Addendum)
 Foot sprain -X-ray left foot shows no acute fracture or dislocation.  Final radiologist read is still pending if report shows any abnormality you will be contacting appropriate management prescribed. -Use prescribed ibuprofen 600 mg every 6 hours as needed for inflammation and pain to the left foot -Continue taking Tylenol and applying IcyHot as needed for pain. -Minimize ambulation and unnecessary standing for long periods of time as this could cause increase in pain -Continue to monitor symptoms if any progression follow-up for further evaluation and management.

## 2023-10-11 DIAGNOSIS — Z419 Encounter for procedure for purposes other than remedying health state, unspecified: Secondary | ICD-10-CM | POA: Diagnosis not present

## 2023-10-26 ENCOUNTER — Inpatient Hospital Stay (HOSPITAL_COMMUNITY)
Admission: AD | Admit: 2023-10-26 | Discharge: 2023-10-26 | Disposition: A | Discharge status: Home / Self Care | Attending: Obstetrics and Gynecology | Admitting: Obstetrics and Gynecology

## 2023-10-26 ENCOUNTER — Encounter (HOSPITAL_COMMUNITY): Payer: Self-pay | Admitting: Obstetrics and Gynecology

## 2023-10-26 ENCOUNTER — Other Ambulatory Visit: Payer: Self-pay

## 2023-10-26 ENCOUNTER — Inpatient Hospital Stay (HOSPITAL_COMMUNITY)

## 2023-10-26 ENCOUNTER — Encounter (HOSPITAL_COMMUNITY): Payer: Self-pay | Admitting: *Deleted

## 2023-10-26 ENCOUNTER — Ambulatory Visit (HOSPITAL_COMMUNITY)
Admission: EM | Admit: 2023-10-26 | Discharge: 2023-10-26 | Disposition: A | Discharge status: Home / Self Care | Attending: Emergency Medicine | Admitting: Emergency Medicine

## 2023-10-26 DIAGNOSIS — R1031 Right lower quadrant pain: Secondary | ICD-10-CM | POA: Insufficient documentation

## 2023-10-26 DIAGNOSIS — B3731 Acute candidiasis of vulva and vagina: Secondary | ICD-10-CM | POA: Diagnosis not present

## 2023-10-26 DIAGNOSIS — Z3490 Encounter for supervision of normal pregnancy, unspecified, unspecified trimester: Secondary | ICD-10-CM

## 2023-10-26 DIAGNOSIS — R1032 Left lower quadrant pain: Secondary | ICD-10-CM | POA: Diagnosis not present

## 2023-10-26 DIAGNOSIS — O98811 Other maternal infectious and parasitic diseases complicating pregnancy, first trimester: Secondary | ICD-10-CM | POA: Insufficient documentation

## 2023-10-26 DIAGNOSIS — R1030 Lower abdominal pain, unspecified: Secondary | ICD-10-CM | POA: Diagnosis not present

## 2023-10-26 DIAGNOSIS — Z3A01 Less than 8 weeks gestation of pregnancy: Secondary | ICD-10-CM

## 2023-10-26 DIAGNOSIS — O26891 Other specified pregnancy related conditions, first trimester: Secondary | ICD-10-CM

## 2023-10-26 DIAGNOSIS — O208 Other hemorrhage in early pregnancy: Secondary | ICD-10-CM | POA: Diagnosis not present

## 2023-10-26 DIAGNOSIS — B379 Candidiasis, unspecified: Secondary | ICD-10-CM | POA: Diagnosis not present

## 2023-10-26 DIAGNOSIS — O3481 Maternal care for other abnormalities of pelvic organs, first trimester: Secondary | ICD-10-CM | POA: Diagnosis not present

## 2023-10-26 LAB — COMPREHENSIVE METABOLIC PANEL WITH GFR
ALT: 14 U/L (ref 0–44)
AST: 18 U/L (ref 15–41)
Albumin: 3.3 g/dL — ABNORMAL LOW (ref 3.5–5.0)
Alkaline Phosphatase: 38 U/L (ref 38–126)
Anion gap: 7 (ref 5–15)
BUN: 8 mg/dL (ref 6–20)
CO2: 20 mmol/L — ABNORMAL LOW (ref 22–32)
Calcium: 8.7 mg/dL — ABNORMAL LOW (ref 8.9–10.3)
Chloride: 108 mmol/L (ref 98–111)
Creatinine, Ser: 0.75 mg/dL (ref 0.44–1.00)
GFR, Estimated: >60 mL/min (ref >60)
Glucose, Bld: 85 mg/dL (ref 70–99)
Potassium: 3.7 mmol/L (ref 3.5–5.1)
Sodium: 135 mmol/L (ref 135–145)
Total Bilirubin: 0.7 mg/dL (ref 0.0–1.2)
Total Protein: 6.5 g/dL (ref 6.5–8.1)

## 2023-10-26 LAB — WET PREP, GENITAL
Clue Cells Wet Prep HPF POC: NONE SEEN
Sperm: NONE SEEN
Trich, Wet Prep: NONE SEEN
WBC, Wet Prep HPF POC: >=10 — AB (ref <10)

## 2023-10-26 LAB — URINALYSIS, ROUTINE W REFLEX MICROSCOPIC
Bacteria, UA: NONE SEEN
Bilirubin Urine: NEGATIVE
Glucose, UA: NEGATIVE mg/dL
Hgb urine dipstick: NEGATIVE
Ketones, ur: 5 mg/dL — AB
Leukocytes,Ua: NEGATIVE
Nitrite: NEGATIVE
Protein, ur: NEGATIVE mg/dL
Specific Gravity, Urine: 1.015 (ref 1.005–1.030)
pH: 6 (ref 5.0–8.0)

## 2023-10-26 LAB — POCT URINALYSIS DIP (MANUAL ENTRY)
Bilirubin, UA: NEGATIVE
Blood, UA: NEGATIVE
Glucose, UA: NEGATIVE mg/dL
Ketones, POC UA: NEGATIVE mg/dL
Nitrite, UA: NEGATIVE
Protein Ur, POC: NEGATIVE mg/dL
Spec Grav, UA: 1.025 (ref 1.010–1.025)
Urobilinogen, UA: 0.2 U/dL
pH, UA: 7 (ref 5.0–8.0)

## 2023-10-26 LAB — CBC
HCT: 35.4 % — ABNORMAL LOW (ref 36.0–46.0)
Hemoglobin: 11.8 g/dL — ABNORMAL LOW (ref 12.0–15.0)
MCH: 30.6 pg (ref 26.0–34.0)
MCHC: 33.3 g/dL (ref 30.0–36.0)
MCV: 91.7 fL (ref 80.0–100.0)
Platelets: 266 10*3/uL (ref 150–400)
RBC: 3.86 MIL/uL — ABNORMAL LOW (ref 3.87–5.11)
RDW: 13.1 % (ref 11.5–15.5)
WBC: 9.4 10*3/uL (ref 4.0–10.5)
nRBC: 0 % (ref 0.0–0.2)

## 2023-10-26 LAB — HCG, QUANTITATIVE, PREGNANCY: hCG, Beta Chain, Quant, S: 3357 m[IU]/mL — ABNORMAL HIGH (ref <5)

## 2023-10-26 LAB — POCT URINE PREGNANCY: Preg Test, Ur: POSITIVE — AB

## 2023-10-26 MED ORDER — ACETAMINOPHEN 500 MG PO TABS
1000.0000 mg | ORAL_TABLET | Freq: Once | ORAL | Status: AC
Start: 2023-10-26 — End: 2023-10-26
  Administered 2023-10-26: 1000 mg via ORAL
  Filled 2023-10-26: qty 2

## 2023-10-26 MED ORDER — PRENATAL PLUS 27-1 MG PO TABS: 1.0000 | ORAL_TABLET | Freq: Every day | ORAL | 9 refills | Status: DC

## 2023-10-26 MED ORDER — TERCONAZOLE 0.4 % VA CREA: 1.0000 | TOPICAL_CREAM | Freq: Every day | VAGINAL | 0 refills | Status: DC

## 2023-10-26 NOTE — MAU Note (Addendum)
.  Molly Hensley is a 28 y.o. at [redacted]w[redacted]d here in MAU reporting: severe abdominal cramping mid lower abdomen since 1030 this morning that has been constant and accompanied by intermittent random sharp shooting pains up the vagina to her abdomen. She rates the pain 9/10, has not tried anything for it. Also reports feeling dizzy and lightheaded just now while sitting in triage. Has not tried anything for the pain. Denies vaginal bleeding. Endorses vaginal itching. Was tested for STIs at urgent care but is open to additional testing here in MAU today. No other complaints.   LMP: 09/22/23 Onset of complaint: 10/26/23 Pain score: 9/10 Vitals:   10/26/23 1449  BP: 123/71  Pulse: 95  Resp: 18  Temp: 98.1 F (36.7 C)  SpO2: 100%     FHT: not assessed d/t early gestation Lab orders placed from triage: UA

## 2023-10-26 NOTE — Discharge Instructions (Addendum)

## 2023-10-26 NOTE — Discharge Instructions (Addendum)
 Go to the Maternity admission unit for evaluation

## 2023-10-26 NOTE — MAU Provider Note (Signed)
 None     S [name] is a 28 y.o. [phone] pregnant female at [redacted]w[redacted]d who presents to MAU today with complaint of cramping in lower abdomen on both sides, started earlier. Started around 1000. She also reports sharp pain in her vagina. She reports she smells an odor "like period blood." She has not taken anything for pain.   Receives care at [facility]. Prenatal records reviewed.  Pertinent items noted in HPI and remainder of comprehensive ROS otherwise negative.   O BP 116/71 (BP Location: Right Arm)   Pulse 78   Temp 98.1 F (36.7 C) (Oral)   Resp 18   LMP 09/22/2023 (Approximate)   SpO2 100%  Physical Exam Vitals and nursing note reviewed.  Constitutional:      General: She is not in acute distress.    Appearance: She is well-developed. She is not ill-appearing, toxic-appearing or diaphoretic.  HENT:     Head: Normocephalic.  Cardiovascular:     Rate and Rhythm: Normal rate.  Pulmonary:     Effort: Pulmonary effort is normal.  Abdominal:     Tenderness: There is no abdominal tenderness.  Skin:    General: Skin is warm.     Capillary Refill: Capillary refill takes less than 2 seconds.  Neurological:     General: No focal deficit present.     Mental Status: She is alert and oriented to person, place, and time.  Psychiatric:        Mood and Affect: Mood normal.        Behavior: Behavior normal.      MDM: [place] workup Wet prep positive for candida Pain responsive to tylenol . IUP present without ectopic  MAU Course:  A Candidiasis of female genitalia  Intrauterine pregnancy  Subchorionic hemorrhage of placenta in first trimester  [place] workup Wet prep positive for candida Pain responsive to tylenol . IUP present without ectopic evidence. No yolk sac or fetal pole identified.   Medical screening exam complete  P Prescription for terconazole 0.4% sent to preferred pharmacy for vaginal candidiasis for use at bedtime. Prenatal vitamins also sent.  Safe  medications in pregnancy list provided.  Discharge from MAU in stable condition with routine precautions Follow up at preferred prenatal provider as scheduled for ongoing prenatal care  Allergies as of 10/26/2023   No Known Allergies      Medication List     STOP taking these medications    ibuprofen  600 MG tablet Commonly known as: ADVIL        TAKE these medications    prenatal vitamin w/FE, FA 27-1 MG Tabs tablet Take 1 tablet by mouth daily at 12 noon.   terconazole 0.4 % vaginal cream Commonly known as: TERAZOL 7 Place 1 applicator vaginally at bedtime. Use for seven days        Raford Bunk, MSN, CNM 10/26/2023 5:48 PM  Certified Nurse Midwife, [facility]

## 2023-10-26 NOTE — ED Provider Notes (Signed)
 [facility]    CSN: [id number] Arrival date & time: 10/26/23  1239      History   Chief Complaint Chief Complaint  Patient presents with   Possible Pregnancy    HPI Molly Hensley is a 28 y.o. female.   Patient reports she is early pregnant and is having lower abdominal pain.  Patient reports that she is unsure of her last period she states she thinks it was in March but it may have been February.  Patient reports that she has had a miscarriage in the past and she is cramping like when she had a miscarriage.  Patient reports she is not having any vaginal bleeding.  Patient denies any fever or chills she denies any burning with urination she reports she has had a vaginal discharge.  No known STD risk.  No language interpreter was used.  Possible Pregnancy Associated symptoms include abdominal pain.    Past Medical History:  Diagnosis Date   Asthma    Childhood   Chlamydia    Headache     Patient Active Problem List   Diagnosis Date Noted   Acute on chronic anemia 07/21/2020   Tobacco use disorder 03/08/2014    Past Surgical History:  Procedure Laterality Date   DILATION AND EVACUATION N/A 02/12/2022   Procedure: DILATATION AND EVACUATION;  Surgeon: Ozan, Jennifer, DO;  Location: [facility];  Service: Gynecology;  Laterality: N/A;   INDUCED ABORTION     TOOTH EXTRACTION      OB History     Gravida  4   Para  1   Term  1   Preterm      AB  1   Living  1      SAB      IAB  1   Ectopic      Multiple  0   Live Births  1            Home Medications    Prior to Admission medications   Medication Sig Start Date End Date Taking? Authorizing Provider  ibuprofen  (ADVIL ) 600 MG tablet Take 1 tablet (600 mg total) by mouth every 6 (six) hours as needed. 09/09/23   Alease Hunter, NP    Family History Family History  Problem Relation Age of Onset   Hypertension Mother    Epilepsy Mother    Heart disease Mother    Cancer Maternal  Grandmother     Social History Social History   Tobacco Use   Smoking status: Some Days    Current packs/day: 0.00    Types: Cigarettes    Last attempt to quit: 01/01/2022    Years since quitting: 1.8   Smokeless tobacco: Never   Tobacco comments:    cutting down  Vaping Use   Vaping status: Never Used  Substance Use Topics   Alcohol use: No    Alcohol/week: 0.0 standard drinks of alcohol   Drug use: Yes    Types: Marijuana    Comment: trying to quit     Allergies   Patient has no known allergies.   Review of Systems Review of Systems  Gastrointestinal:  Positive for abdominal pain.  All other systems reviewed and are negative.    Physical Exam Triage Vital Signs ED Triage Vitals  Encounter Vitals Group     BP 10/26/23 1309 111/68     Systolic BP Percentile --      Diastolic BP Percentile --  Pulse Rate 10/26/23 1309 93     Resp 10/26/23 1309 16     Temp 10/26/23 1309 98 F (36.7 C)     Temp Source 10/26/23 1309 Oral     SpO2 10/26/23 1309 98 %     Weight --      Height --      Head Circumference --      Peak Flow --      Pain Score 10/26/23 1311 8     Pain Loc --      Pain Education --      Exclude from Growth Chart --    No data found.  Updated Vital Signs BP 111/68   Pulse 93   Temp 98 F (36.7 C) (Oral)   Resp 16   LMP 09/22/2023 (Approximate)   SpO2 98%   Visual Acuity Right Eye Distance:   Left Eye Distance:   Bilateral Distance:    Right Eye Near:   Left Eye Near:    Bilateral Near:     Physical Exam Vitals and nursing note reviewed.  Constitutional:      Appearance: She is well-developed.  HENT:     Head: Normocephalic.  Cardiovascular:     Rate and Rhythm: Normal rate.  Pulmonary:     Effort: Pulmonary effort is normal.  Abdominal:     Tenderness: There is abdominal tenderness.     Comments: Lower abdomen tender to palpation  Musculoskeletal:        General: Normal range of motion.     Cervical back: Normal range  of motion.  Skin:    General: Skin is warm.  Neurological:     Mental Status: She is alert and oriented to person, place, and time.      UC Treatments / Results  Labs (all labs ordered are listed, but only abnormal results are displayed) Labs Reviewed  POCT URINE PREGNANCY - Abnormal; Notable for the following components:      Result Value   Preg Test, Ur Positive (*)    All other components within normal limits  POCT URINALYSIS DIP (MANUAL ENTRY) - Abnormal; Notable for the following components:   Clarity, UA cloudy (*)    Leukocytes, UA Small (1+) (*)    All other components within normal limits    EKG   Radiology No results found.  Procedures Procedures (including critical care time)  Medications Ordered in UC Medications - No data to display  Initial Impression / Assessment and Plan / UC Course  I have reviewed the triage vital signs and the nursing notes.  Pertinent labs & imaging results that were available during my care of the patient were reviewed by me and considered in my medical decision making (see chart for details).     Patient is early pregnant with abdominal pain.  Patient is advised to go to maternity admissions for further evaluation. Final Clinical Impressions(s) / UC Diagnoses   Final diagnoses:  Less than [redacted] weeks gestation of pregnancy  Lower abdominal pain     Discharge Instructions      Go to the Maternity admission unit for evaluation    ED Prescriptions   None    PDMP not reviewed this encounter.   Sandi Crosby, PA-C 10/26/23 1351

## 2023-10-26 NOTE — ED Triage Notes (Addendum)
 Pt reports positive home pregnancy 2 days ago. C/O intermittent sharp vaginal pains today; states pain starting across low abd as well now. C/O n/v - reports emesis x3 today. Denies diarrhea. C/O vaginal discharge; pt feels she may have BV or yeast infection. Denies vaginal bleeding, but c/o vaginal odor.

## 2023-10-27 LAB — GC/CHLAMYDIA PROBE AMP (~~LOC~~) NOT AT ARMC
Chlamydia: NEGATIVE
Comment: NEGATIVE
Comment: NORMAL
Neisseria Gonorrhea: NEGATIVE

## 2023-10-27 LAB — ABO/RH: ABO/RH(D): A POS

## 2023-11-10 DIAGNOSIS — Z419 Encounter for procedure for purposes other than remedying health state, unspecified: Secondary | ICD-10-CM | POA: Diagnosis not present

## 2023-11-12 ENCOUNTER — Ambulatory Visit (HOSPITAL_COMMUNITY)
Admission: EM | Admit: 2023-11-12 | Discharge: 2023-11-12 | Disposition: A | Discharge status: Home / Self Care | Attending: Emergency Medicine | Admitting: Emergency Medicine

## 2023-11-12 ENCOUNTER — Other Ambulatory Visit: Payer: Self-pay

## 2023-11-12 ENCOUNTER — Encounter (HOSPITAL_COMMUNITY): Payer: Self-pay | Admitting: Emergency Medicine

## 2023-11-12 DIAGNOSIS — J069 Acute upper respiratory infection, unspecified: Secondary | ICD-10-CM

## 2023-11-12 LAB — POC COVID19/FLU A&B COMBO
Covid Antigen, POC: NEGATIVE
Influenza A Antigen, POC: NEGATIVE
Influenza B Antigen, POC: NEGATIVE

## 2023-11-12 NOTE — ED Triage Notes (Signed)
 Pt reports persistent cough and nasal congestion since Monday.

## 2023-11-12 NOTE — Discharge Instructions (Addendum)
 COVID and flu testing were negative. I believe your symptoms are from a viral illness. You can take 650 mg of Tylenol  every 4-6 hours as needed for pain and fever. I recommend Mucinex  for cough and congestion as needed. I have also attached a list of medications that are safe to take while pregnant. Stay hydrated and get plenty of rest. Return here if symptoms persist or worsen.

## 2023-11-12 NOTE — ED Provider Notes (Signed)
 [facility]    CSN: [id number] Arrival date & time: 11/12/23  1315      History   Chief Complaint Chief Complaint  Patient presents with   Cough    HPI Molly Hensley is a 28 y.o. female.   Patients with cough, nasal congestion, and subjective fever that began on 5/12. Denies shortness of breath, chest pain, abdominal pain, nausea, vomiting, and diarrhea.  Daughter is sick with similar symptoms. Patient is approximately [redacted] weeks pregnant.  Patient states that she has been taking Tylenol  and some over-the-counter medication for her symptoms with relief.   The history is provided by the patient and medical records.  Cough   Past Medical History:  Diagnosis Date   Asthma    Childhood   Chlamydia    Headache     Patient Active Problem List   Diagnosis Date Noted   Acute on chronic anemia 07/21/2020   Tobacco use disorder 03/08/2014    Past Surgical History:  Procedure Laterality Date   DILATION AND EVACUATION N/A 02/12/2022   Procedure: DILATATION AND EVACUATION;  Surgeon: Ozan, Jennifer, DO;  Location: [facility];  Service: Gynecology;  Laterality: N/A;   INDUCED ABORTION     TOOTH EXTRACTION      OB History     Gravida  4   Para  1   Term  1   Preterm      AB  2   Living  1      SAB  1   IAB  1   Ectopic      Multiple  0   Live Births  1            Home Medications    Prior to Admission medications   Medication Sig Start Date End Date Taking? Authorizing Provider  prenatal vitamin w/FE, FA (PRENATAL 1 + 1) 27-1 MG TABS tablet Take 1 tablet by mouth daily at 12 noon. 10/26/23   Warren-Hill, Aviva Lemmings, CNM  terconazole  (TERAZOL 7 ) 0.4 % vaginal cream Place 1 applicator vaginally at bedtime. Use for seven days 10/26/23   Salomon Cree, CNM    Family History Family History  Problem Relation Age of Onset   Hypertension Mother    Epilepsy Mother    Heart disease Mother    Cancer Maternal Grandmother     Social  History Social History   Tobacco Use   Smoking status: Some Days    Current packs/day: 0.00    Types: Cigarettes    Last attempt to quit: 01/01/2022    Years since quitting: 1.8   Smokeless tobacco: Never   Tobacco comments:    cutting down  Vaping Use   Vaping status: Never Used  Substance Use Topics   Alcohol use: No    Alcohol/week: 0.0 standard drinks of alcohol   Drug use: Yes    Types: Marijuana    Comment: trying to quit     Allergies   Patient has no known allergies.   Review of Systems Review of Systems  Respiratory:  Positive for cough.    Per HPI  Physical Exam Triage Vital Signs ED Triage Vitals [11/12/23 1334]  Encounter Vitals Group     BP 117/80     Systolic BP Percentile      Diastolic BP Percentile      Pulse Rate 98     Resp 18     Temp 98.5 F (36.9 C)     Temp  Source Oral     SpO2 98 %     Weight      Height      Head Circumference      Peak Flow      Pain Score 5     Pain Loc      Pain Education      Exclude from Growth Chart    No data found.  Updated Vital Signs BP 117/80 (BP Location: Right Arm)   Pulse 98   Temp 98.5 F (36.9 C) (Oral)   Resp 18   LMP 09/22/2023 (Approximate)   SpO2 98%   Visual Acuity Right Eye Distance:   Left Eye Distance:   Bilateral Distance:    Right Eye Near:   Left Eye Near:    Bilateral Near:     Physical Exam Vitals and nursing note reviewed.  Constitutional:      General: She is awake. She is not in acute distress.    Appearance: Normal appearance. She is well-developed and well-groomed. She is not ill-appearing.  HENT:     Right Ear: Tympanic membrane, ear canal and external ear normal.     Left Ear: Tympanic membrane, ear canal and external ear normal.     Nose: Rhinorrhea present. No congestion.     Mouth/Throat:     Mouth: Mucous membranes are moist.     Pharynx: Posterior oropharyngeal erythema present. No oropharyngeal exudate.  Cardiovascular:     Rate and Rhythm: Normal  rate and regular rhythm.  Pulmonary:     Effort: Pulmonary effort is normal.     Breath sounds: Normal breath sounds.  Skin:    General: Skin is warm and dry.  Neurological:     Mental Status: She is alert.  Psychiatric:        Behavior: Behavior is cooperative.      UC Treatments / Results  Labs (all labs ordered are listed, but only abnormal results are displayed) Labs Reviewed  POC COVID19/FLU A&B COMBO    EKG   Radiology No results found.  Procedures Procedures (including critical care time)  Medications Ordered in UC Medications - No data to display  Initial Impression / Assessment and Plan / UC Course  I have reviewed the triage vital signs and the nursing notes.  Pertinent labs & imaging results that were available during my care of the patient were reviewed by me and considered in my medical decision making (see chart for details).     Patient is well-appearing.  Vitals are stable.  Upon assessment congestion or rhinorrhea are present, mild erythema noted to pharynx.  Strong congested cough noted on exam.  Lungs clear bilaterally on auscultation.  COVID and flu testing negative.  Symptoms likely viral in nature.  Discussed over-the-counter medication as needed for symptoms.  Discussed return precautions. Final Clinical Impressions(s) / UC Diagnoses   Final diagnoses:  Viral URI with cough     Discharge Instructions      COVID and flu testing were negative. I believe your symptoms are from a viral illness. You can take 650 mg of Tylenol  every 4-6 hours as needed for pain and fever. I recommend Mucinex  for cough and congestion as needed. I have also attached a list of medications that are safe to take while pregnant. Stay hydrated and get plenty of rest. Return here if symptoms persist or worsen.     ED Prescriptions   None    PDMP not reviewed this encounter.   Rosevelt Constable,  Quavon Keisling A, NP 11/12/23 1447

## 2023-11-18 ENCOUNTER — Inpatient Hospital Stay (HOSPITAL_COMMUNITY)

## 2023-11-18 ENCOUNTER — Inpatient Hospital Stay (HOSPITAL_COMMUNITY)
Admission: AD | Admit: 2023-11-18 | Discharge: 2023-11-18 | Disposition: A | Discharge status: Home / Self Care | Attending: Obstetrics and Gynecology | Admitting: Obstetrics and Gynecology

## 2023-11-18 DIAGNOSIS — F17210 Nicotine dependence, cigarettes, uncomplicated: Secondary | ICD-10-CM | POA: Diagnosis not present

## 2023-11-18 DIAGNOSIS — R102 Pelvic and perineal pain: Secondary | ICD-10-CM | POA: Insufficient documentation

## 2023-11-18 DIAGNOSIS — O99331 Smoking (tobacco) complicating pregnancy, first trimester: Secondary | ICD-10-CM | POA: Insufficient documentation

## 2023-11-18 DIAGNOSIS — O26899 Other specified pregnancy related conditions, unspecified trimester: Secondary | ICD-10-CM

## 2023-11-18 DIAGNOSIS — O3680X0 Pregnancy with inconclusive fetal viability, not applicable or unspecified: Secondary | ICD-10-CM

## 2023-11-18 DIAGNOSIS — O26891 Other specified pregnancy related conditions, first trimester: Secondary | ICD-10-CM | POA: Diagnosis not present

## 2023-11-18 DIAGNOSIS — Z3A01 Less than 8 weeks gestation of pregnancy: Secondary | ICD-10-CM | POA: Diagnosis not present

## 2023-11-18 DIAGNOSIS — R1032 Left lower quadrant pain: Secondary | ICD-10-CM | POA: Diagnosis present

## 2023-11-18 DIAGNOSIS — Z3A08 8 weeks gestation of pregnancy: Secondary | ICD-10-CM

## 2023-11-18 DIAGNOSIS — Z3689 Encounter for other specified antenatal screening: Secondary | ICD-10-CM | POA: Diagnosis not present

## 2023-11-18 LAB — CBC
HCT: 33.3 % — ABNORMAL LOW (ref 36.0–46.0)
Hemoglobin: 11.3 g/dL — ABNORMAL LOW (ref 12.0–15.0)
MCH: 31.3 pg (ref 26.0–34.0)
MCHC: 33.9 g/dL (ref 30.0–36.0)
MCV: 92.2 fL (ref 80.0–100.0)
Platelets: 285 10*3/uL (ref 150–400)
RBC: 3.61 MIL/uL — ABNORMAL LOW (ref 3.87–5.11)
RDW: 13.4 % (ref 11.5–15.5)
WBC: 10.7 10*3/uL — ABNORMAL HIGH (ref 4.0–10.5)
nRBC: 0 % (ref 0.0–0.2)

## 2023-11-18 LAB — URINALYSIS, ROUTINE W REFLEX MICROSCOPIC
Bilirubin Urine: NEGATIVE
Glucose, UA: NEGATIVE mg/dL
Hgb urine dipstick: NEGATIVE
Ketones, ur: 20 mg/dL — AB
Leukocytes,Ua: NEGATIVE
Nitrite: NEGATIVE
Protein, ur: NEGATIVE mg/dL
Specific Gravity, Urine: 1.02 (ref 1.005–1.030)
pH: 6 (ref 5.0–8.0)

## 2023-11-18 LAB — COMPREHENSIVE METABOLIC PANEL WITH GFR
ALT: 17 U/L (ref 0–44)
AST: 20 U/L (ref 15–41)
Albumin: 3.4 g/dL — ABNORMAL LOW (ref 3.5–5.0)
Alkaline Phosphatase: 41 U/L (ref 38–126)
Anion gap: 7 (ref 5–15)
BUN: 10 mg/dL (ref 6–20)
CO2: 22 mmol/L (ref 22–32)
Calcium: 9.1 mg/dL (ref 8.9–10.3)
Chloride: 107 mmol/L (ref 98–111)
Creatinine, Ser: 0.79 mg/dL (ref 0.44–1.00)
GFR, Estimated: >60 mL/min (ref >60)
Glucose, Bld: 99 mg/dL (ref 70–99)
Potassium: 3.6 mmol/L (ref 3.5–5.1)
Sodium: 136 mmol/L (ref 135–145)
Total Bilirubin: 0.6 mg/dL (ref 0.0–1.2)
Total Protein: 6.7 g/dL (ref 6.5–8.1)

## 2023-11-18 LAB — WET PREP, GENITAL
Clue Cells Wet Prep HPF POC: NONE SEEN
Sperm: NONE SEEN
Trich, Wet Prep: NONE SEEN
WBC, Wet Prep HPF POC: <10 (ref <10)

## 2023-11-18 LAB — HCG, QUANTITATIVE, PREGNANCY: hCG, Beta Chain, Quant, S: 40938 m[IU]/mL — ABNORMAL HIGH (ref <5)

## 2023-11-18 MED ORDER — TRAMADOL HCL 50 MG PO TABS: 50.0000 mg | ORAL_TABLET | Freq: Four times a day (QID) | ORAL | 0 refills | Status: DC | PRN

## 2023-11-18 MED ORDER — FLUCONAZOLE 150 MG PO TABS: 150.0000 mg | ORAL_TABLET | Freq: Every day | ORAL | 1 refills | Status: DC

## 2023-11-18 NOTE — Progress Notes (Signed)
 Written and verbal d/c instructions given and understanding voiced.

## 2023-11-18 NOTE — MAU Note (Signed)
 Molly Hensley is a 28 y.o. at [redacted]w[redacted]d here in MAU reporting being at work about 1820 and started having sharp, cramping on LLQ. Feels like she needs to urinate but it's just a "drip". Denies dysuria. States she was here recently with similar pain and told had vag yeast which could cause pain. Completed treatment. Denies any d/c like she was having when she had yeast. Hx SAB and states had bad cramping and wants to be sure everything is ok. No VB.   LMP: n/a Onset of complaint: 1820 Pain score: 8 Vitals:   11/18/23 1956 11/18/23 1959  BP:  130/75  Pulse: 91   Resp: 18   Temp: 98.7 F (37.1 C)   SpO2: 99%      FHT: n/a  Lab orders placed from triage: u/a

## 2023-11-18 NOTE — MAU Provider Note (Signed)
 Chief Complaint: Abdominal Pain and Vaginal Discharge   Event Date/Time   First Provider Initiated Contact with Patient 11/18/23 2135        SUBJECTIVE HPI: Molly Hensley is a 28 y.o. [id number] at [redacted]w[redacted]d by LMP who presents to maternity admissions reporting sudden sharp pain in left lower abdomen.  . She denies vaginal bleeding, urinary symptoms, or fever/chills.     Abdominal Pain This is a new problem. The current episode started today. The pain is located in the LLQ. The quality of the pain is cramping. Pertinent negatives include no diarrhea, fever, nausea or vomiting.   RN Note: Molly Hensley is a 28 y.o. at [redacted]w[redacted]d here in MAU reporting being at work about 1820 and started having sharp, cramping on LLQ. Feels like she needs to urinate but it's just a "drip". Denies dysuria. States she was here recently with similar pain and told had vag yeast which could cause pain. Completed treatment. Denies any d/c like she was having when she had yeast. Hx SAB and states had bad cramping and wants to be sure everything is ok. No VB.   Past Medical History:  Diagnosis Date   Asthma    Childhood   Chlamydia    Headache    Past Surgical History:  Procedure Laterality Date   DILATION AND EVACUATION N/A 02/12/2022   Procedure: DILATATION AND EVACUATION;  Surgeon: Ozan, Jennifer, DO;  Location: [facility];  Service: Gynecology;  Laterality: N/A;   INDUCED ABORTION     TOOTH EXTRACTION     Social History   Socioeconomic History   Marital status: Single    Spouse name: Not on file   Number of children: Not on file   Years of education: Not on file   Highest education level: Not on file  Occupational History   Not on file  Tobacco Use   Smoking status: Some Days    Current packs/day: 0.00    Types: Cigarettes    Last attempt to quit: 01/01/2022    Years since quitting: 1.8   Smokeless tobacco: Never   Tobacco comments:    cutting down  Vaping Use   Vaping status: Never Used  Substance and  Sexual Activity   Alcohol use: No    Alcohol/week: 0.0 standard drinks of alcohol   Drug use: Yes    Types: Marijuana    Comment: trying to quit   Sexual activity: Yes    Comment: pregnant  Other Topics Concern   Not on file  Social History Narrative   Not on file   Social Drivers of Health   Financial Resource Strain: Not on file  Food Insecurity: Not on file  Transportation Needs: Not on file  Physical Activity: Not on file  Stress: Not on file  Social Connections: Not on file  Intimate Partner Violence: Not on file   No current facility-administered medications on file prior to encounter.   Current Outpatient Medications on File Prior to Encounter  Medication Sig Dispense Refill   prenatal vitamin w/FE, FA (PRENATAL 1 + 1) 27-1 MG TABS tablet Take 1 tablet by mouth daily at 12 noon. 30 tablet 9   terconazole  (TERAZOL 7 ) 0.4 % vaginal cream Place 1 applicator vaginally at bedtime. Use for seven days 45 g 0   No Known Allergies  I have reviewed patient's Past Medical Hx, Surgical Hx, Family Hx, Social Hx, medications and allergies.   ROS:  Review of Systems  Constitutional:  Negative for chills  and fever.  Gastrointestinal:  Positive for abdominal pain. Negative for diarrhea, nausea and vomiting.   Review of Systems  Other systems negative   Physical Exam  Physical Exam Patient Vitals for the past 24 hrs:  BP Temp Pulse Resp SpO2 Height Weight  11/18/23 1959 130/75 -- -- -- -- -- --  11/18/23 1956 -- 98.7 F (37.1 C) 91 18 99 % 5\' 3"  (1.6 m) 92.5 kg   Constitutional: Well-developed, well-nourished female in no acute distress.  Cardiovascular: normal rate Respiratory: normal effort GI: Abd soft, non-tender.  MS: Extremities nontender, no edema, normal ROM Neurologic: Alert and oriented x 4.   PELVIC EXAM: deferred in lieu of transvaginal ultrasound  LAB RESULTS Results for orders placed or performed during the hospital encounter of 11/18/23 (from the past  24 hours)  Urinalysis, Routine w reflex microscopic -Urine, Clean Catch     Status: Abnormal   Collection Time: 11/18/23  8:10 PM  Result Value Ref Range   Color, Urine YELLOW YELLOW   APPearance HAZY (A) CLEAR   Specific Gravity, Urine 1.020 1.005 - 1.030   pH 6.0 5.0 - 8.0   Glucose, UA NEGATIVE NEGATIVE mg/dL   Hgb urine dipstick NEGATIVE NEGATIVE   Bilirubin Urine NEGATIVE NEGATIVE   Ketones, ur 20 (A) NEGATIVE mg/dL   Protein, ur NEGATIVE NEGATIVE mg/dL   Nitrite NEGATIVE NEGATIVE   Leukocytes,Ua NEGATIVE NEGATIVE  ABO/Rh     Status: None   Collection Time: 11/18/23  9:36 PM  Result Value Ref Range   ABO/RH(D) A POS    No rh immune globuloin      NOT A RH IMMUNE GLOBULIN CANDIDATE, PT RH POSITIVE Performed at [facility], [address] [place], [place] [address]   CBC     Status: Abnormal   Collection Time: 11/18/23  9:43 PM  Result Value Ref Range   WBC 10.7 (H) 4.0 - 10.5 K/uL   RBC 3.61 (L) 3.87 - 5.11 MIL/uL   Hemoglobin 11.3 (L) 12.0 - 15.0 g/dL   HCT 60.4 (L) 54.0 - 98.1 %   MCV 92.2 80.0 - 100.0 fL   MCH 31.3 26.0 - 34.0 pg   MCHC 33.9 30.0 - 36.0 g/dL   RDW 19.1 47.8 - 29.5 %   Platelets 285 150 - 400 K/uL   nRBC 0.0 0.0 - 0.2 %  Comprehensive metabolic panel with GFR     Status: Abnormal   Collection Time: 11/18/23  9:43 PM  Result Value Ref Range   Sodium 136 135 - 145 mmol/L   Potassium 3.6 3.5 - 5.1 mmol/L   Chloride 107 98 - 111 mmol/L   CO2 22 22 - 32 mmol/L   Glucose, Bld 99 70 - 99 mg/dL   BUN 10 6 - 20 mg/dL   Creatinine, Ser 6.21 0.44 - 1.00 mg/dL   Calcium 9.1 8.9 - 30.8 mg/dL   Total Protein 6.7 6.5 - 8.1 g/dL   Albumin 3.4 (L) 3.5 - 5.0 g/dL   AST 20 15 - 41 U/L   ALT 17 0 - 44 U/L   Alkaline Phosphatase 41 38 - 126 U/L   Total Bilirubin 0.6 0.0 - 1.2 mg/dL   GFR, Estimated >65 >78 mL/min   Anion gap 7 5 - 15  hCG, quantitative, pregnancy     Status: Abnormal   Collection Time: 11/18/23  9:43 PM  Result Value Ref Range    hCG, Beta Chain, Quant, S 40,938 (H) <5 mIU/mL  Wet prep, genital     Status: Abnormal   Collection Time: 11/18/23 10:10 PM   Specimen: Vaginal  Result Value Ref Range   Yeast Wet Prep HPF POC PRESENT (A) NONE SEEN   Trich, Wet Prep NONE SEEN NONE SEEN   Clue Cells Wet Prep HPF POC NONE SEEN NONE SEEN   WBC, Wet Prep HPF POC <10 <10   Sperm NONE SEEN     --/--/A POS (04/27 1519)  IMAGING US  OB Transvaginal Result Date: 11/18/2023 CLINICAL DATA:  Positive pregnancy test EXAM: TRANSVAGINAL OB ULTRASOUND TECHNIQUE: Transvaginal ultrasound was performed for complete evaluation of the gestation as well as the maternal uterus, adnexal regions, and pelvic cul-de-sac. COMPARISON:  10/26/2023 FINDINGS: Intrauterine gestational sac: Present Yolk sac:  Present, 5.6 mm Embryo:  Absent MSD: 21.7 mm   7 w   1 d Subchorionic hemorrhage:  None visualized. Maternal uterus/adnexae: Within normal limits. IMPRESSION: Findings are highly suspicious but not yet definitive for failed pregnancy. Some criteria (sec diameter of 21 mm) suggest non-viability and some (absence of embryo greater than 2 weeks after previous scan that showed a sac without a yolk sac) suggest suspicious for non-viability. Recommend correlation with beta HCG levels. Follow-up ultrasound can be performed as clinically indicated. This recommendation follows SRU consensus guidelines: Diagnostic Criteria for Nonviable Pregnancy Early in the First Trimester. Mel Spine Med 2013; 147:8295-62. Electronically Signed   By: Violeta Grey M.D.   On: 11/18/2023 22:33     MAU Management/MDM: I have reviewed the triage vital signs and the nursing notes.   Pertinent labs & imaging results that were available during my care of the patient were reviewed by me and considered in my medical decision making (see chart for details).      I have reviewed her medical records including past results, notes and treatments. Medical, Surgical, and family history were  reviewed.  Medications and recent lab tests were reviewed  Ordered usual first trimester r/o ectopic labs.   Pelvic cultures done Will check baseline Ultrasound to rule out ectopic.  This bleeding/pain can represent a normal pregnancy with bleeding, spontaneous abortion or even an ectopic which can be life-threatening.  The process as listed above helps to determine which of these is present.  Discussed results and implications of probable nonviable pregnancy.  Discussed that radiologist recommends one more ultrasound so will convert New OB appt to US  appt.   ASSESSMENT Pregnancy at [redacted]w[redacted]d by LMP Pelvic pain  Pregnancy of uncertain viability   PLAN Discharge home Plan to repeat Ultrasound in about 7-10 days  SAB precautions   Pt stable at time of discharge. Encouraged to return here if she develops worsening of symptoms, increase in pain, fever, or other concerning symptoms.    Holmes Lusher CNM, MSN Certified Nurse-Midwife 11/18/2023  9:35 PM

## 2023-11-19 ENCOUNTER — Other Ambulatory Visit: Payer: Self-pay | Admitting: Advanced Practice Midwife

## 2023-11-19 DIAGNOSIS — O3680X0 Pregnancy with inconclusive fetal viability, not applicable or unspecified: Secondary | ICD-10-CM

## 2023-11-19 LAB — ABO/RH: ABO/RH(D): A POS

## 2023-11-19 LAB — GC/CHLAMYDIA PROBE AMP (~~LOC~~) NOT AT ARMC
Chlamydia: NEGATIVE
Comment: NEGATIVE
Comment: NORMAL
Neisseria Gonorrhea: NEGATIVE

## 2023-11-19 NOTE — Progress Notes (Signed)
 Schedule followup US  for uncertain viability

## 2023-11-21 ENCOUNTER — Ambulatory Visit: Payer: Self-pay | Admitting: Obstetrics and Gynecology

## 2023-11-26 ENCOUNTER — Other Ambulatory Visit: Payer: Self-pay

## 2023-11-26 ENCOUNTER — Ambulatory Visit: Admitting: Certified Nurse Midwife

## 2023-11-26 ENCOUNTER — Ambulatory Visit (INDEPENDENT_AMBULATORY_CARE_PROVIDER_SITE_OTHER)

## 2023-11-26 VITALS — BP 95/65 | HR 98 | Wt 204.0 lb

## 2023-11-26 DIAGNOSIS — O3680X0 Pregnancy with inconclusive fetal viability, not applicable or unspecified: Secondary | ICD-10-CM

## 2023-11-26 DIAGNOSIS — Z3A01 Less than 8 weeks gestation of pregnancy: Secondary | ICD-10-CM

## 2023-11-26 DIAGNOSIS — O021 Missed abortion: Secondary | ICD-10-CM

## 2023-11-26 NOTE — Progress Notes (Signed)
 History:  [name] is a 28 y.o. [phone] who presents to clinic today for repeat U/S to assess pregnancy viability. Reports no bleeding/cramping today.   The following portions of the patient's history were reviewed and updated as appropriate: allergies, current medications, family history, past medical history, social history, past surgical history and problem list.  Review of Systems:  Pertinent items noted in HPI and remainder of comprehensive ROS otherwise negative.   Objective:  Physical Exam BP 95/65   Pulse 98   Wt 204 lb (92.5 kg)   LMP 09/22/2023 (Approximate)   BMI 36.14 kg/m  Physical Exam Vitals and nursing note reviewed.  Constitutional:      General: She is not in acute distress.    Appearance: Normal appearance. She is normal weight. She is not ill-appearing.  Cardiovascular:     Rate and Rhythm: Normal rate and regular rhythm.  Pulmonary:     Effort: Pulmonary effort is normal.  Musculoskeletal:        General: Normal range of motion.  Skin:    General: Skin is warm and dry.     Capillary Refill: Capillary refill takes less than 2 seconds.  Neurological:     Mental Status: She is alert and oriented to person, place, and time.  Psychiatric:        Mood and Affect: Mood normal.        Behavior: Behavior normal.    Labs and Imaging Study Result  Narrative & Impression    ----------------------------------------------------------------------  OBSTETRICS REPORT                       (Signed Final 12/03/2023 09:33 am) ---------------------------------------------------------------------- Patient Info    ID #:       [id number]                          D.O.B.:  [birth year] (27 yrs)(F)  Name:       Molly Hensley                  Visit Date: 11/26/2023 02:39 pm ---------------------------------------------------------------------- Performed By    Attending:        Janna Melter MD     Ref. Address:     [address]                                                              [place], [place]                                                             [address]  Performed By:     Donnalee Gab         [facility]                                                             [facility]                                                             [facility]  Referred By:      [facility] ---------------------------------------------------------------------- Orders    #  Description                           Code        Ordered By  1  US  OB TRANSVAGINAL                    [id number]     Holmes Lusher ----------------------------------------------------------------------    #  Order #                     Accession #                Episode #  1  [id number]                   [id number]                 [id number] ---------------------------------------------------------------------- Indications    Weeks of gestation of pregnancy not            Z3A.00  specified  Pregnancy with inconclusive fetal viability    O36.80X0  Encounter for uncertain dates                  Z36.87 ---------------------------------------------------------------------- Fetal Evaluation    Num Of Fetuses:         1  Gest. Sac:              Irregular  Yolk Sac:               Visualized  Fetal Pole:             Not visualized  Cardiac Activity:       No embryo visualized    Comment:    Slightly irregular shaped GS w/ YS seen meas [redacted]w[redacted]d w/ internal              debris. Probable corpus luteum RT ovary ---------------------------------------------------------------------- Impression    Ultrasound showing irregulat gestational sac with mean sac  diameter 251 mm and no embryo. Likely  corpus luteum cyst  on right  ovary ---------------------------------------------------------------------- Recommendations  Meets criteria for failed pregnancy due to no embryo with  cardiac activity greater than 14 days after prior ultrasound  showing gestational sac with no fetal pole. ----------------------------------------------------------------------                Janna Melter, MD Electronically Signed Final Report   12/03/2023 09:33 am   Assessment & Plan:  1. Missed abortion (Primary) - Shared results with patient, she was not surprised given previous ultrasound results. - Discussed options including expectant management, cytotec  dosing or D&C.  - Pt strongly prefers D&C. Will send message to surgery scheduler.  Follow up two weeks after procedure.  Lemuel Quaker, CNM, MSN, IBCLC Certified Nurse Midwife, [facility]

## 2023-11-27 ENCOUNTER — Other Ambulatory Visit: Payer: Self-pay

## 2023-11-27 ENCOUNTER — Other Ambulatory Visit: Payer: Self-pay | Admitting: Obstetrics and Gynecology

## 2023-11-27 ENCOUNTER — Encounter (HOSPITAL_COMMUNITY): Payer: Self-pay | Admitting: Obstetrics and Gynecology

## 2023-11-27 DIAGNOSIS — O021 Missed abortion: Secondary | ICD-10-CM

## 2023-11-27 NOTE — Progress Notes (Signed)
 SDW call  Patient was given pre-op instructions over the phone. Patient verbalized understanding of instructions provided.     PCP - [facility] Cardiologist -  Pulmonary:    PPM/ICD - denies Device Orders - na Rep Notified - na   Chest x-ray - na EKG -  na Stress Test - ECHO -  Cardiac Cath -   Sleep Study/sleep apnea/CPAP: denies  Non-diabetic  Blood Thinner Instructions: denies Aspirin Instructions:denies   ERAS Protcol - Clears until 1230   Anesthesia review: No   Patient denies shortness of breath, fever, cough and chest pain over the phone call  Your procedure is scheduled on Friday Nov 28, 2023  Report to [facility] Main Entrance "A" at 1300 PM., then check in with the Admitting office.  Call this number if you have problems the morning of surgery:  [phone]   If you have any questions prior to your surgery date call [phone]: Open Monday-Friday 8am-4pm If you experience any cold or flu symptoms such as cough, fever, chills, shortness of breath, etc. between now and your scheduled surgery, please notify us  at the above number    Remember:  Do not eat after midnight the night before your surgery  You may drink clear liquids until  1230 the day of your surgery.   Clear liquids allowed are: Water, Non-Citrus Juices (without pulp), Carbonated Beverages, Clear Tea, Black Coffee ONLY (NO MILK, CREAM OR POWDERED CREAMER of any kind), and Gatorade   Take these medicines if needed the morning of surgery with A SIP OF WATER:  Tramadol  As of today, STOP taking any Aspirin (unless otherwise instructed by your surgeon) Aleve, Naproxen, Ibuprofen , Motrin , Advil , Goody's, BC's, all herbal medications, fish oil, and all vitamins.

## 2023-11-28 ENCOUNTER — Other Ambulatory Visit: Payer: Self-pay

## 2023-11-28 ENCOUNTER — Encounter (HOSPITAL_COMMUNITY): Payer: Self-pay | Admitting: Obstetrics and Gynecology

## 2023-11-28 ENCOUNTER — Ambulatory Visit (HOSPITAL_COMMUNITY)
Admission: RE | Admit: 2023-11-28 | Discharge: 2023-11-28 | Disposition: A | Discharge status: Home / Self Care | Attending: Obstetrics and Gynecology | Admitting: Obstetrics and Gynecology

## 2023-11-28 ENCOUNTER — Ambulatory Visit (HOSPITAL_COMMUNITY): Admitting: Anesthesiology

## 2023-11-28 ENCOUNTER — Encounter (HOSPITAL_COMMUNITY)
Admission: RE | Disposition: A | Discharge status: Home / Self Care | Payer: Self-pay | Source: Home / Self Care | Attending: Obstetrics and Gynecology

## 2023-11-28 DIAGNOSIS — O021 Missed abortion: Secondary | ICD-10-CM

## 2023-11-28 DIAGNOSIS — Z3A09 9 weeks gestation of pregnancy: Secondary | ICD-10-CM | POA: Diagnosis not present

## 2023-11-28 DIAGNOSIS — J45909 Unspecified asthma, uncomplicated: Secondary | ICD-10-CM | POA: Diagnosis not present

## 2023-11-28 DIAGNOSIS — F1290 Cannabis use, unspecified, uncomplicated: Secondary | ICD-10-CM | POA: Diagnosis not present

## 2023-11-28 DIAGNOSIS — F17210 Nicotine dependence, cigarettes, uncomplicated: Secondary | ICD-10-CM | POA: Diagnosis not present

## 2023-11-28 HISTORY — PX: DILATION AND EVACUATION: SHX1459

## 2023-11-28 SURGERY — DILATION AND EVACUATION, UTERUS
Anesthesia: General | Site: Uterus

## 2023-11-28 MED ORDER — KETOROLAC TROMETHAMINE 30 MG/ML IJ SOLN
30.0000 mg | Freq: Once | INTRAMUSCULAR | Status: DC | PRN
Start: 2023-11-28 — End: 2023-11-28

## 2023-11-28 MED ORDER — ACETAMINOPHEN 500 MG PO TABS
1000.0000 mg | ORAL_TABLET | Freq: Once | ORAL | Status: AC
  Administered 2023-11-28: 1000 mg via ORAL

## 2023-11-28 MED ORDER — CARBOPROST TROMETHAMINE 250 MCG/ML IM SOLN
INTRAMUSCULAR | Status: AC
  Filled 2023-11-28: qty 1

## 2023-11-28 MED ORDER — CHLORHEXIDINE GLUCONATE 0.12 % MT SOLN
15.0000 mL | Freq: Once | OROMUCOSAL | Status: AC
  Administered 2023-11-28: 15 mL via OROMUCOSAL

## 2023-11-28 MED ORDER — OXYCODONE HCL 5 MG/5ML PO SOLN: 5.0000 mg | Freq: Once | ORAL | Status: DC | PRN

## 2023-11-28 MED ORDER — ACETAMINOPHEN 500 MG PO TABS
ORAL_TABLET | ORAL | Status: DC
Start: 2023-11-28 — End: 2023-11-28
  Filled 2023-11-28: qty 2

## 2023-11-28 MED ORDER — IBUPROFEN 600 MG PO TABS: 600.0000 mg | ORAL_TABLET | Freq: Four times a day (QID) | ORAL | 0 refills | Status: DC | PRN

## 2023-11-28 MED ORDER — DOXYCYCLINE HYCLATE 100 MG IV SOLR
200.0000 mg | INTRAVENOUS | Status: AC
  Administered 2023-11-28: 200 mg via INTRAVENOUS
  Filled 2023-11-28: qty 200

## 2023-11-28 MED ORDER — FENTANYL CITRATE (PF) 250 MCG/5ML IJ SOLN
INTRAMUSCULAR | Status: AC
  Filled 2023-11-28: qty 5

## 2023-11-28 MED ORDER — PHENYLEPHRINE 80 MCG/ML (10ML) SYRINGE FOR IV PUSH (FOR BLOOD PRESSURE SUPPORT)
PREFILLED_SYRINGE | INTRAVENOUS | Status: DC | PRN
  Administered 2023-11-28 (×3): 160 ug via INTRAVENOUS

## 2023-11-28 MED ORDER — MIDAZOLAM HCL 2 MG/2ML IJ SOLN
INTRAMUSCULAR | Status: DC | PRN
  Administered 2023-11-28: 2 mg via INTRAVENOUS

## 2023-11-28 MED ORDER — HYDROMORPHONE HCL 1 MG/ML IJ SOLN: 0.2500 mg | INTRAMUSCULAR | Status: DC | PRN

## 2023-11-28 MED ORDER — ONDANSETRON HCL 4 MG/2ML IJ SOLN: 4.0000 mg | Freq: Once | INTRAMUSCULAR | Status: DC | PRN

## 2023-11-28 MED ORDER — LIDOCAINE 2% (20 MG/ML) 5 ML SYRINGE
INTRAMUSCULAR | Status: DC | PRN
  Administered 2023-11-28: 100 mg via INTRAVENOUS

## 2023-11-28 MED ORDER — METHYLERGONOVINE MALEATE 0.2 MG/ML IJ SOLN
INTRAMUSCULAR | Status: AC
  Filled 2023-11-28: qty 1

## 2023-11-28 MED ORDER — MISOPROSTOL 200 MCG PO TABS
ORAL_TABLET | ORAL | Status: AC
  Filled 2023-11-28: qty 5

## 2023-11-28 MED ORDER — ORAL CARE MOUTH RINSE: 15.0000 mL | Freq: Once | OROMUCOSAL | Status: AC

## 2023-11-28 MED ORDER — ONDANSETRON HCL 4 MG/2ML IJ SOLN
INTRAMUSCULAR | Status: DC | PRN
  Administered 2023-11-28: 4 mg via INTRAVENOUS

## 2023-11-28 MED ORDER — CHLORHEXIDINE GLUCONATE 0.12 % MT SOLN
OROMUCOSAL | Status: DC
Start: 2023-11-28 — End: 2023-11-28
  Filled 2023-11-28: qty 15

## 2023-11-28 MED ORDER — ACETAMINOPHEN 500 MG PO TABS: 1000.0000 mg | ORAL_TABLET | Freq: Four times a day (QID) | ORAL | 0 refills | Status: DC | PRN

## 2023-11-28 MED ORDER — LACTATED RINGERS IV SOLN: INTRAVENOUS | Status: DC

## 2023-11-28 MED ORDER — TRANEXAMIC ACID-NACL 1000-0.7 MG/100ML-% IV SOLN
INTRAVENOUS | Status: AC
  Filled 2023-11-28: qty 100

## 2023-11-28 MED ORDER — PROPOFOL 10 MG/ML IV BOLUS
INTRAVENOUS | Status: DC | PRN
  Administered 2023-11-28: 150 mg via INTRAVENOUS

## 2023-11-28 MED ORDER — FENTANYL CITRATE (PF) 250 MCG/5ML IJ SOLN
INTRAMUSCULAR | Status: DC | PRN
  Administered 2023-11-28: 50 ug via INTRAVENOUS
  Administered 2023-11-28: 100 ug via INTRAVENOUS

## 2023-11-28 MED ORDER — 0.9 % SODIUM CHLORIDE (POUR BTL) OPTIME
TOPICAL | Status: DC | PRN
  Administered 2023-11-28: 1000 mL

## 2023-11-28 MED ORDER — MIDAZOLAM HCL 2 MG/2ML IJ SOLN
INTRAMUSCULAR | Status: AC
  Filled 2023-11-28: qty 2

## 2023-11-28 MED ORDER — DEXAMETHASONE SODIUM PHOSPHATE 10 MG/ML IJ SOLN
INTRAMUSCULAR | Status: DC | PRN
  Administered 2023-11-28: 10 mg via INTRAVENOUS

## 2023-11-28 MED ORDER — OXYCODONE HCL 5 MG PO TABS: 5.0000 mg | ORAL_TABLET | Freq: Once | ORAL | Status: DC | PRN

## 2023-11-28 MED ORDER — SILVER NITRATE-POT NITRATE 75-25 % EX MISC
CUTANEOUS | Status: AC
  Filled 2023-11-28: qty 10

## 2023-11-28 MED ORDER — DEXMEDETOMIDINE HCL IN NACL 80 MCG/20ML IV SOLN
INTRAVENOUS | Status: DC | PRN
  Administered 2023-11-28: 8 ug via INTRAVENOUS
  Administered 2023-11-28: 4 ug via INTRAVENOUS

## 2023-11-28 SURGICAL SUPPLY — 21 items
CATH ROBINSON RED A/P 16FR (CATHETERS) ×2 IMPLANT
COVER MAYO STAND STRL (DRAPES) ×2 IMPLANT
FILTER UTR ASPR ASSEMBLY (MISCELLANEOUS) ×2 IMPLANT
GAUZE 4X4 16PLY ~~LOC~~+RFID DBL (SPONGE) IMPLANT
GLOVE BIO SURGEON STRL SZ7 (GLOVE) ×2 IMPLANT
GLOVE BIOGEL PI IND STRL 7.0 (GLOVE) ×2 IMPLANT
GOWN STRL REUS W/ TWL LRG LVL3 (GOWN DISPOSABLE) IMPLANT
GOWN STRL REUS W/ TWL XL LVL3 (GOWN DISPOSABLE) ×2 IMPLANT
HOSE CONNECTING 18IN BERKELEY (TUBING) ×2 IMPLANT
KIT BERKELEY 1ST TRI 3/8 NO TR (MISCELLANEOUS) ×2 IMPLANT
KIT BERKELEY 1ST TRIMESTER 3/8 (MISCELLANEOUS) ×2 IMPLANT
NS IRRIG 1000ML POUR BTL (IV SOLUTION) ×2 IMPLANT
PACK VAGINAL MINOR WOMEN LF (CUSTOM PROCEDURE TRAY) ×2 IMPLANT
PAD OB MATERNITY 11 LF (PERSONAL CARE ITEMS) ×2 IMPLANT
SET BERKELEY SUCTION TUBING (SUCTIONS) ×2 IMPLANT
TOWEL GREEN STERILE FF (TOWEL DISPOSABLE) ×2 IMPLANT
UNDERPAD 30X36 HEAVY ABSORB (UNDERPADS AND DIAPERS) ×2 IMPLANT
VACURETTE 10 RIGID CVD (CANNULA) IMPLANT
VACURETTE 7MM CVD STRL WRAP (CANNULA) IMPLANT
VACURETTE 8 RIGID CVD (CANNULA) IMPLANT
VACURETTE 9 RIGID CVD (CANNULA) IMPLANT

## 2023-11-28 NOTE — Transfer of Care (Signed)
 Immediate Anesthesia Transfer of Care Note  Patient: Molly Hensley  Procedure(s) Performed: DILATION AND EVACUATION, UTERUS (Uterus)  Patient Location: PACU  Anesthesia Type:General  Level of Consciousness: drowsy  Airway & Oxygen Therapy: Patient Spontanous Breathing and Patient connected to face mask oxygen  Post-op Assessment: Report given to RN and Post -op Vital signs reviewed and stable  Post vital signs: Reviewed and stable  Last Vitals:  Vitals Value Taken Time  BP    Temp    Pulse    Resp    SpO2      Last Pain:  Vitals:   11/28/23 1028  TempSrc: Oral  PainSc: 7       Patients Stated Pain Goal: 7 (11/28/23 1028)  Complications: No notable events documented.

## 2023-11-28 NOTE — Anesthesia Preprocedure Evaluation (Addendum)
 Anesthesia Evaluation  Patient identified by MRN, date of birth, ID band Patient awake    Reviewed: Allergy & Precautions, NPO status , Patient's Chart, lab work & pertinent test results  Airway Mallampati: II  TM Distance: >3 FB Neck ROM: Full    Dental no notable dental hx. (+) Teeth Intact, Dental Advisory Given   Pulmonary Current Smoker and Patient abstained from smoking.   Pulmonary exam normal breath sounds clear to auscultation       Cardiovascular negative cardio ROS Normal cardiovascular exam Rhythm:Regular Rate:Normal     Neuro/Psych neg Headaches    GI/Hepatic negative GI ROS, Neg liver ROS,,,  Endo/Other  negative endocrine ROS    Renal/GU negative Renal ROS     Musculoskeletal negative musculoskeletal ROS (+)    Abdominal   Peds  Hematology Lab Results      Component                Value               Date                      WBC                      10.7 (H)            11/18/2023                HGB                      11.3 (L)            11/18/2023                HCT                      33.3 (L)            11/18/2023                MCV                      92.2                11/18/2023                PLT                      285                 11/18/2023              Anesthesia Other Findings NKDA  Reproductive/Obstetrics 9.4 wk Missed AB                             Anesthesia Physical Anesthesia Plan  ASA: 3  Anesthesia Plan: General   Post-op Pain Management: Toradol  IV (intra-op)*, Tylenol  PO (pre-op)* and Precedex   Induction: Intravenous  PONV Risk Score and Plan: 3 and Treatment may vary due to age or medical condition, Midazolam , Propofol  infusion and Ondansetron   Airway Management Planned: LMA  Additional Equipment: None  Intra-op Plan:   Post-operative Plan:   Informed Consent: I have reviewed the patients History and Physical, chart, labs and  discussed the procedure including the risks, benefits and alternatives for the proposed anesthesia with the patient or authorized representative  who has indicated his/her understanding and acceptance.     Dental advisory given  Plan Discussed with: CRNA and Surgeon  Anesthesia Plan Comments: (9.4 wk missed AB w BMI 36.1 for D&E under LMA)       Anesthesia Quick Evaluation

## 2023-11-28 NOTE — Op Note (Signed)
 Preop Diagnosis: Missed abortion Postop Diagnosis: Same Procedure: Dilation and evacuation Surgeon: Dr. Loralyn Rochester Assist: Dr. Delora Ferry Anesthesia: LMA  EBL: 25 cc IVF: 800 cc  UOP: Not collected Complications: None  Findings: Normal anteverted uterus, products of conception noted. Bedside ultrasound with gestational sac prior to procedure and thin uterine stripe at end of the procedure  Description of the procedure: Preop antibiotics of doxycycline  given. Informed consent reviewed and signed. Pt given opportunity to ask questions.   Pt prepped and draped in the dorsal lithotomy fashion after LMA anesthesia found to be adequate. Timeout performed.   Open-sided speculum placed into the vagina. Cervix grasped with a single tooth tenaculum. Cervix progressively dilated to a 25 [place].  I used a 8 mm rigid curette. Several passes done with the suction curette and the tissue was retrieved. A gentle pass was done with a sharp curette. A gritty texture noted in all areas of the uterus confirming complete evacuation. Minimal bleeding noted. Procedure completed. All instruments removed. Counts correct x2.   Products of conception sent to pathology. Accepts genetics. Rh Positive - rhogam not indicated  Pt taken to recovery room in stable condition.  Loralyn Rochester, MD Obstetrician & Gynecologist, [facility] for [employer], [facility]

## 2023-11-28 NOTE — Discharge Instructions (Addendum)
 Post-surgical Instructions, Outpatient Surgery  You may expect to feel dizzy, weak, and drowsy for as long as 24 hours after receiving the medicine that made you sleep (anesthetic). For the first 24 hours after your surgery:   Do not drive a car, ride a bicycle, participate in physical activities, or take public transportation until you are done taking narcotic pain medicines Do not drink alcohol or take tranquilizers.  Do not take medicine that has not been prescribed by your physicians.  Do not sign important papers or make important decisions while on narcotic pain medicines.  Have a responsible person with you.   PAIN MANAGEMENT Ibuprofen  800mg .  (This is the same as 3-200mg  over the counter tablets of Motrin  or ibuprofen .)  Take this every 6 hours or as needed for cramping.   Acetaminophen  1000mg  (This is the same as 2-500mg  over the counter extra strength tylenol ). Take this every 6 hours for the first 3 days or as needed afterwards for pain  DO'S AND DON'T'S Do not take a tub bath for 2 weeks.  You may shower on the first day after your surgery Do move around as you feel able.  Stairs are fine.  You may begin to exercise again as you feel able.  Do not put anything in the vagina for two weeks--no tampons, intercourse, or douching.    REGULAR MEDIATIONS/VITAMINS: You may restart all of your regular medications as prescribed. You may restart all of your vitamins as you normally take them.    PLEASE CALL OR SEEK MEDICAL CARE IF: You have persistent nausea and vomiting.  You have trouble eating or drinking.  You have an oral temperature above 100.5.  You have heavy vaginal bleeding       No acetaminophen /Tylenol  until after 4:40 pm today if needed.     Post Anesthesia Home Care Instructions  Activity: Get plenty of rest for the remainder of the day. A responsible individual must stay with you for 24 hours following the procedure.  For the next 24 hours, DO NOT: -Drive a  car -Advertising copywriter -Drink alcoholic beverages -Take any medication unless instructed by your physician -Make any legal decisions or sign important papers.  Meals: Start with liquid foods such as gelatin or soup. Progress to regular foods as tolerated. Avoid greasy, spicy, heavy foods. If nausea and/or vomiting occur, drink only clear liquids until the nausea and/or vomiting subsides. Call your physician if vomiting continues.  Special Instructions/Symptoms: Your throat may feel dry or sore from the anesthesia or the breathing tube placed in your throat during surgery. If this causes discomfort, gargle with warm salt water. The discomfort should disappear within 24 hours.

## 2023-11-28 NOTE — H&P (Signed)
 PRE OPERATIVE HISTORY AND PHYSICAL   Subjective:  Molly Hensley is a 28 y.o. [id number] at [redacted]w[redacted]d by LMP 09/22/23 presenting for scheduled D&E for missed abortion  Meets criteria for early pregnancy loss by no embryo with cardiac activity 2+ weeks after US  w/ GS AND by MSD 25mm and no embryo.   No complaints today. Doing OK.   Past Medical History:  Diagnosis Date   Asthma    Childhood   Chlamydia    Headache    Past Surgical History:  Procedure Laterality Date   DILATION AND EVACUATION N/A 02/12/2022   Procedure: DILATATION AND EVACUATION;  Surgeon: Ozan, Jennifer, DO;  Location: [facility] OR;  Service: Gynecology;  Laterality: N/A;   INDUCED ABORTION     TOOTH EXTRACTION     No current facility-administered medications on file prior to encounter.   Current Outpatient Medications on File Prior to Encounter  Medication Sig Dispense Refill   prenatal vitamin w/FE, FA (PRENATAL 1 + 1) 27-1 MG TABS tablet Take 1 tablet by mouth daily at 12 noon. 30 tablet 9   traMADol (ULTRAM) 50 MG tablet Take 1 tablet (50 mg total) by mouth every 6 (six) hours as needed for moderate pain (pain score 4-6). 15 tablet 0   No Known Allergies OB History     Gravida  4   Para  1   Term  1   Preterm      AB  2   Living  1      SAB  1   IAB  1   Ectopic      Multiple  0   Live Births  1          Social History   Socioeconomic History   Marital status: Single    Spouse name: Not on file   Number of children: Not on file   Years of education: Not on file   Highest education level: Not on file  Occupational History   Not on file  Tobacco Use   Smoking status: Some Days    Current packs/day: 0.00    Types: Cigarettes    Last attempt to quit: 01/01/2022    Years since quitting: 1.9   Smokeless tobacco: Never   Tobacco comments:    cutting down  Vaping Use   Vaping status: Never Used  Substance and Sexual Activity   Alcohol use: No    Alcohol/week: 0.0 standard drinks of  alcohol   Drug use: Yes    Types: Marijuana    Comment: trying to quit, last use 11/27/23 pm   Sexual activity: Yes    Comment: pregnant  Other Topics Concern   Not on file  Social History Narrative   Not on file   Social Drivers of Health   Financial Resource Strain: Not on file  Food Insecurity: Not on file  Transportation Needs: Not on file  Physical Activity: Not on file  Stress: Not on file  Social Connections: Not on file  Intimate Partner Violence: Not on file    Objective:   Vitals:   11/28/23 1000 11/28/23 1028  BP:  120/70  Pulse:  85  Resp:  16  TempSrc:  Oral  SpO2:  100%  Weight: 92.5 kg   Height: 5\' 3"  (1.6 m)    General:  Alert, oriented and cooperative. Patient is in no acute distress.  Skin: Skin is warm and dry. No rash noted.   Cardiovascular: Normal heart rate noted  Respiratory: Normal respiratory effort, no problems with respiration noted   Assessment and Plan:  Molly Hensley is a 28 y.o. at [redacted]w[redacted]d by LMP 09/22/23 presenting for scheduled D&E for missed abortion  - Diagnosis: missed abortion (miscarriage) - Planned surgery: dilation and evacuation with Anora testing  - Risks of surgery include but are not limited to: bleeding, infection, uterine perforation, injury to surrounding organs/tissues, need for additional procedures, VTE, and adhesion formation - We discussed postop restrictions, precautions and expectations - All questions answered - Doxy for pre op antibiotics - To OR when ready  Future Appointments  Date Time Provider Department Center  12/03/2023  8:15 AM [facility] INTAKE [facility]  12/12/2023  8:55 AM Noreene Bearded, PA [facility]   Izell Marsh, MD

## 2023-11-28 NOTE — Anesthesia Postprocedure Evaluation (Signed)
 Anesthesia Post Note  Patient: Molly Hensley  Procedure(s) Performed: DILATION AND EVACUATION, UTERUS WITH ANORA CHROMOSOME STUDIES (Uterus)     Patient location during evaluation: PACU Anesthesia Type: General Level of consciousness: awake and alert Pain management: pain level controlled Vital Signs Assessment: post-procedure vital signs reviewed and stable Respiratory status: spontaneous breathing, nonlabored ventilation, respiratory function stable and patient connected to nasal cannula oxygen Cardiovascular status: blood pressure returned to baseline and stable Postop Assessment: no apparent nausea or vomiting Anesthetic complications: no  No notable events documented.  Last Vitals:  Vitals:   11/28/23 1332 11/28/23 1345  BP: 112/69 106/67  Pulse: 72 63  Resp: 20 17  Temp:    SpO2: 98% 100%    Last Pain:  Vitals:   11/28/23 1345  TempSrc:   PainSc: 0-No pain                 Rosalita Combe

## 2023-11-28 NOTE — Anesthesia Procedure Notes (Signed)
 Procedure Name: LMA Insertion Date/Time: 11/28/2023 12:27 PM  Performed by: Viki Graver, CRNAPre-anesthesia Checklist: Patient identified, Emergency Drugs available, Suction available and Patient being monitored Patient Re-evaluated:Patient Re-evaluated prior to induction Oxygen Delivery Method: Circle System Utilized Preoxygenation: Pre-oxygenation with 100% oxygen Induction Type: IV induction Ventilation: Mask ventilation without difficulty LMA: LMA inserted LMA Size: 4.0 Number of attempts: 1 Placement Confirmation: positive ETCO2 Tube secured with: Tape Dental Injury: Teeth and Oropharynx as per pre-operative assessment

## 2023-11-29 ENCOUNTER — Encounter (HOSPITAL_COMMUNITY): Payer: Self-pay | Admitting: Obstetrics and Gynecology

## 2023-12-01 ENCOUNTER — Ambulatory Visit: Payer: Self-pay | Admitting: Obstetrics and Gynecology

## 2023-12-01 LAB — SURGICAL PATHOLOGY

## 2023-12-03 ENCOUNTER — Telehealth

## 2023-12-10 LAB — ANORA MISCARRIAGE TEST - FRESH

## 2023-12-11 DIAGNOSIS — Z419 Encounter for procedure for purposes other than remedying health state, unspecified: Secondary | ICD-10-CM | POA: Diagnosis not present

## 2023-12-12 ENCOUNTER — Encounter: Payer: Self-pay | Admitting: Family Medicine

## 2024-01-10 DIAGNOSIS — Z419 Encounter for procedure for purposes other than remedying health state, unspecified: Secondary | ICD-10-CM | POA: Diagnosis not present

## 2024-02-10 DIAGNOSIS — Z419 Encounter for procedure for purposes other than remedying health state, unspecified: Secondary | ICD-10-CM | POA: Diagnosis not present

## 2024-02-28 ENCOUNTER — Encounter (HOSPITAL_COMMUNITY): Payer: Self-pay

## 2024-02-28 ENCOUNTER — Ambulatory Visit (HOSPITAL_COMMUNITY)
Admission: EM | Admit: 2024-02-28 | Discharge: 2024-02-28 | Disposition: A | Discharge status: Home / Self Care | Attending: Emergency Medicine | Admitting: Emergency Medicine

## 2024-02-28 DIAGNOSIS — R051 Acute cough: Secondary | ICD-10-CM | POA: Diagnosis not present

## 2024-02-28 DIAGNOSIS — J069 Acute upper respiratory infection, unspecified: Secondary | ICD-10-CM

## 2024-02-28 DIAGNOSIS — J3489 Other specified disorders of nose and nasal sinuses: Secondary | ICD-10-CM | POA: Diagnosis not present

## 2024-02-28 LAB — POC COVID19/FLU A&B COMBO
Covid Antigen, POC: NEGATIVE
Influenza A Antigen, POC: NEGATIVE
Influenza B Antigen, POC: NEGATIVE

## 2024-02-28 MED ORDER — PREDNISONE 20 MG PO TABS: 40.0000 mg | ORAL_TABLET | Freq: Every day | ORAL | 0 refills | Status: AC

## 2024-02-28 MED ORDER — AZELASTINE HCL 0.1 % NA SOLN: 2.0000 | Freq: Two times a day (BID) | NASAL | 0 refills | Status: AC

## 2024-02-28 NOTE — ED Triage Notes (Signed)
 Patient came in today with c/o nasal congestion, fever, cough, headache, and hot flashes since Wednesday. Symptoms worsened on Friday.  She has been taking a cough and cold medicine with little relief. Her daughter is also sick with the same symptoms.

## 2024-02-28 NOTE — Discharge Instructions (Signed)
 COVID and flu testing were both negative today.  I believe your symptoms are likely related to a viral respiratory illness. You can take 2 tablets of prednisone  once daily for 3 days to assist with sinus pressure. Use azelastine  nasal spray twice daily to help with congestion and sinus pressure. Otherwise you can take over-the-counter Mucinex  to help with congestion and a cough. Alternate between 650 mg of Tylenol  and 400 mg of ibuprofen  every 6-8 hours as needed for any pain or fever. Make sure you are staying hydrated and getting plenty of rest. Follow-up with your primary care provider or return here as needed.

## 2024-02-28 NOTE — ED Provider Notes (Signed)
 [facility]    CSN: [id number] Arrival date & time: 02/28/24  1009      History   Chief Complaint Chief Complaint  Patient presents with   Nasal Congestion    HPI Molly Hensley is a 28 y.o. female.   Patient presents with cough, nasal congestion, fever, headache, and hot flashes that began on 8/27.  Patient reports her symptoms worsened yesterday.  Patient reports that she has also had a significant amount of sinus pressure.  Denies chest pain, shortness of breath, nausea, vomiting, diarrhea.  Patient reports that she has been taking over-the-counter cough and cold medicine with little relief.  Patient reports that her daughter is sick with similar symptoms.   The history is provided by the patient and medical records.    Past Medical History:  Diagnosis Date   Asthma    Childhood   Chlamydia    Headache     Patient Active Problem List   Diagnosis Date Noted   Missed abortion 11/28/2023   Acute on chronic anemia 07/21/2020   Tobacco use disorder 03/08/2014    Past Surgical History:  Procedure Laterality Date   DILATION AND EVACUATION N/A 02/12/2022   Procedure: DILATATION AND EVACUATION;  Surgeon: Ozan, Jennifer, DO;  Location: [facility] OR;  Service: Gynecology;  Laterality: N/A;   DILATION AND EVACUATION N/A 11/28/2023   Procedure: DILATION AND EVACUATION, UTERUS WITH ANORA CHROMOSOME STUDIES;  Surgeon: Erik Kieth BROCKS, MD;  Location: [facility] OR;  Service: Gynecology;  Laterality: N/A;   INDUCED ABORTION     TOOTH EXTRACTION      OB History     Gravida  4   Para  1   Term  1   Preterm      AB  2   Living  1      SAB  1   IAB  1   Ectopic      Multiple  0   Live Births  1            Home Medications    Prior to Admission medications   Medication Sig Start Date End Date Taking? Authorizing Provider  azelastine  (ASTELIN ) 0.1 % nasal spray Place 2 sprays into both nostrils 2 (two) times daily. Use in each nostril as directed  02/28/24  Yes Johnie Flaming A, NP  predniSONE  (DELTASONE ) 20 MG tablet Take 2 tablets (40 mg total) by mouth daily for 3 days. 02/28/24 03/02/24 Yes Egidio Lofgren A, NP  acetaminophen  (TYLENOL ) 500 MG tablet Take 2 tablets (1,000 mg total) by mouth every 6 (six) hours as needed. 11/28/23   Erik Kieth BROCKS, MD  ibuprofen  (ADVIL ) 600 MG tablet Take 1 tablet (600 mg total) by mouth every 6 (six) hours as needed. 11/28/23   Erik Kieth BROCKS, MD    Family History Family History  Problem Relation Age of Onset   Hypertension Mother    Epilepsy Mother    Heart disease Mother    Cancer Maternal Grandmother     Social History Social History   Tobacco Use   Smoking status: Some Days    Current packs/day: 0.00    Types: Cigarettes    Last attempt to quit: 01/01/2022    Years since quitting: 2.1   Smokeless tobacco: Never   Tobacco comments:    cutting down  Vaping Use   Vaping status: Never Used  Substance Use Topics   Alcohol use: No    Alcohol/week: 0.0 standard drinks of alcohol  Drug use: Yes    Types: Marijuana    Comment: trying to quit, last use 11/27/23 pm     Allergies   Patient has no known allergies.   Review of Systems Review of Systems  Per HPI  Physical Exam Triage Vital Signs ED Triage Vitals [02/28/24 1050]  Encounter Vitals Group     BP 106/69     Girls Systolic BP Percentile      Girls Diastolic BP Percentile      Boys Systolic BP Percentile      Boys Diastolic BP Percentile      Pulse Rate 82     Resp 16     Temp 99 F (37.2 C)     Temp Source Oral     SpO2 98 %     Weight      Height      Head Circumference      Peak Flow      Pain Score 0     Pain Loc      Pain Education      Exclude from Growth Chart    No data found.  Updated Vital Signs BP 106/69 (BP Location: Left Arm)   Pulse 82   Temp 99 F (37.2 C) (Oral)   Resp 16   LMP 02/22/2024 (Approximate)   SpO2 98%   Breastfeeding No   Visual Acuity Right Eye Distance:    Left Eye Distance:   Bilateral Distance:    Right Eye Near:   Left Eye Near:    Bilateral Near:     Physical Exam Vitals and nursing note reviewed.  Constitutional:      General: She is awake.     Appearance: Normal appearance. She is well-developed and well-groomed.  HENT:     Right Ear: Tympanic membrane, ear canal and external ear normal.     Left Ear: Tympanic membrane, ear canal and external ear normal.     Nose: Congestion and rhinorrhea present.     Right Sinus: No maxillary sinus tenderness or frontal sinus tenderness.     Left Sinus: No maxillary sinus tenderness or frontal sinus tenderness.     Mouth/Throat:     Mouth: Mucous membranes are moist.     Pharynx: Posterior oropharyngeal erythema and postnasal drip present. No oropharyngeal exudate.  Cardiovascular:     Rate and Rhythm: Normal rate and regular rhythm.  Pulmonary:     Effort: Pulmonary effort is normal.     Breath sounds: Normal breath sounds.  Skin:    General: Skin is warm and dry.  Neurological:     Mental Status: She is alert.  Psychiatric:        Behavior: Behavior is cooperative.      UC Treatments / Results  Labs (all labs ordered are listed, but only abnormal results are displayed) Labs Reviewed  POC COVID19/FLU A&B COMBO    EKG   Radiology No results found.  Procedures Procedures (including critical care time)  Medications Ordered in UC Medications - No data to display  Initial Impression / Assessment and Plan / UC Course  I have reviewed the triage vital signs and the nursing notes.  Pertinent labs & imaging results that were available during my care of the patient were reviewed by me and considered in my medical decision making (see chart for details).     Patient is overall well-appearing.  Vitals are stable.  Congestion and rhinorrhea are present, erythema and PND noted to posterior  oropharynx.  Lungs clear bilaterally to auscultation.  COVID and flu testing negative.   Symptoms likely viral in nature.  Prescribed short prednisone  burst to assist with sinus pressure as well as azelastine  nasal spray.  Discussed over-the-counter medications as needed for symptoms.  Discussed follow-up and return precautions. Final Clinical Impressions(s) / UC Diagnoses   Final diagnoses:  Acute cough  Viral URI  Sinus pressure     Discharge Instructions      COVID and flu testing were both negative today.  I believe your symptoms are likely related to a viral respiratory illness. You can take 2 tablets of prednisone  once daily for 3 days to assist with sinus pressure. Use azelastine  nasal spray twice daily to help with congestion and sinus pressure. Otherwise you can take over-the-counter Mucinex  to help with congestion and a cough. Alternate between 650 mg of Tylenol  and 400 mg of ibuprofen  every 6-8 hours as needed for any pain or fever. Make sure you are staying hydrated and getting plenty of rest. Follow-up with your primary care provider or return here as needed.     ED Prescriptions     Medication Sig Dispense Auth. Provider   predniSONE  (DELTASONE ) 20 MG tablet Take 2 tablets (40 mg total) by mouth daily for 3 days. 6 tablet Johnie Flaming A, NP   azelastine  (ASTELIN ) 0.1 % nasal spray Place 2 sprays into both nostrils 2 (two) times daily. Use in each nostril as directed 30 mL Johnie Flaming A, NP      PDMP not reviewed this encounter.   Johnie Flaming A, NP 02/28/24 1151

## 2024-03-12 DIAGNOSIS — Z419 Encounter for procedure for purposes other than remedying health state, unspecified: Secondary | ICD-10-CM | POA: Diagnosis not present

## 2024-05-06 ENCOUNTER — Encounter (HOSPITAL_COMMUNITY): Payer: Self-pay

## 2024-05-06 ENCOUNTER — Ambulatory Visit (HOSPITAL_COMMUNITY)
Admission: EM | Admit: 2024-05-06 | Discharge: 2024-05-06 | Disposition: A | Discharge status: Home / Self Care | Attending: Family Medicine | Admitting: Family Medicine

## 2024-05-06 DIAGNOSIS — J0100 Acute maxillary sinusitis, unspecified: Secondary | ICD-10-CM | POA: Diagnosis not present

## 2024-05-06 DIAGNOSIS — R0981 Nasal congestion: Secondary | ICD-10-CM | POA: Diagnosis not present

## 2024-05-06 MED ORDER — AMOXICILLIN-POT CLAVULANATE 875-125 MG PO TABS: 1.0000 | ORAL_TABLET | Freq: Two times a day (BID) | ORAL | 0 refills | Status: DC

## 2024-05-06 NOTE — ED Provider Notes (Signed)
 [facility]   [id number] 05/06/24 Arrival Time: 1234  ASSESSMENT & PLAN:  1. Acute non-recurrent maxillary sinusitis   2. Nasal congestion    Begin: Meds ordered this encounter  Medications   amoxicillin-clavulanate (AUGMENTIN) 875-125 MG tablet    Sig: Take 1 tablet by mouth every 12 (twelve) hours.    Dispense:  14 tablet    Refill:  0   Work note provided. OTC symptom care as needed. Ensure adequate fluid intake and rest.   Follow-up Information     [facility], [facility].   Specialty: Pediatrics Why: As needed. Contact information: [address] [place] [address] [phone]                 Reviewed expectations re: course of current medical issues. Questions answered. Outlined signs and symptoms indicating need for more acute intervention. Patient verbalized understanding. After Visit Summary given.   SUBJECTIVE: History from: patient.  Molly Hensley is a 28 y.o. female who presents with complaint of nasal congestion, post-nasal drainage, and sinus pain. Onset gradual, a week ago. Respiratory symptoms: none. Fever: absent. Overall normal PO intake without n/v. OTC treatment: various without much relief. History of frequent sinus infections: no. No specific aggravating or alleviating factors reported. Social History   Tobacco Use  Smoking Status Some Days   Current packs/day: 0.00   Types: Cigarettes   Last attempt to quit: 01/01/2022   Years since quitting: 2.3  Smokeless Tobacco Never  Tobacco Comments   cutting down    ROS: As per HPI.  OBJECTIVE:  Vitals:   05/06/24 1400  BP: 91/63  Pulse: (!) 116  Resp: 16  Temp: 98.6 F (37 C)  TempSrc: Oral  SpO2: 96%     General appearance: alert; no distress HEENT: nasal congestion; clear runny nose; throat irritation secondary to post-nasal drainage; bilateral maxillary tenderness to palpation; turbinates boggy Neck: supple without LAD; trachea  midline Lungs: unlabored respirations, symmetrical air entry; cough: absent; no respiratory distress Skin: warm and dry Psychological: alert and cooperative; normal mood and affect  No Known Allergies  Past Medical History:  Diagnosis Date   Asthma    Childhood   Chlamydia    Headache    Family History  Problem Relation Age of Onset   Hypertension Mother    Epilepsy Mother    Heart disease Mother    Cancer Maternal Grandmother    Social History   Socioeconomic History   Marital status: Single    Spouse name: Not on file   Number of children: Not on file   Years of education: Not on file   Highest education level: Not on file  Occupational History   Not on file  Tobacco Use   Smoking status: Some Days    Current packs/day: 0.00    Types: Cigarettes    Last attempt to quit: 01/01/2022    Years since quitting: 2.3   Smokeless tobacco: Never   Tobacco comments:    cutting down  Vaping Use   Vaping status: Never Used  Substance and Sexual Activity   Alcohol use: No    Alcohol/week: 0.0 standard drinks of alcohol   Drug use: Yes    Types: Marijuana    Comment: trying to quit, last use 11/27/23 pm   Sexual activity: Yes    Comment: pregnant  Other Topics Concern   Not on file  Social History Narrative   Not on file   Social Drivers  of Health   Financial Resource Strain: Not on file  Food Insecurity: Not on file  Transportation Needs: Not on file  Physical Activity: Not on file  Stress: Not on file  Social Connections: Not on file  Intimate Partner Violence: Not on file             Rolinda Rogue, MD 05/06/24 1537

## 2024-05-06 NOTE — ED Triage Notes (Signed)
 Patient here today with c/o nasal congestion and headache X 6 days. Patient has been taking Robitussin with some relief.

## 2024-05-06 NOTE — Discharge Instructions (Addendum)
 You may also use over the counter AFRIN nasal spray. This medication is for use in the nose. Take it as directed on the label. Shake well before using. Do not use it more often than directed. Do not use for more than 3 days in a row without talking to your care team first. Make sure that you are using your nasal spray correctly.

## 2024-05-17 ENCOUNTER — Ambulatory Visit (HOSPITAL_COMMUNITY)
Admission: EM | Admit: 2024-05-17 | Discharge: 2024-05-17 | Disposition: A | Discharge status: Home / Self Care | Attending: Emergency Medicine | Admitting: Emergency Medicine

## 2024-05-17 ENCOUNTER — Encounter (HOSPITAL_COMMUNITY): Payer: Self-pay | Admitting: Emergency Medicine

## 2024-05-17 DIAGNOSIS — B349 Viral infection, unspecified: Secondary | ICD-10-CM

## 2024-05-17 DIAGNOSIS — R509 Fever, unspecified: Secondary | ICD-10-CM

## 2024-05-17 LAB — POCT INFLUENZA A/B
Influenza A, POC: NEGATIVE
Influenza B, POC: NEGATIVE

## 2024-05-17 LAB — POC SOFIA SARS ANTIGEN FIA: SARS Coronavirus 2 Ag: NEGATIVE

## 2024-05-17 MED ORDER — ACETAMINOPHEN 325 MG PO TABS
ORAL_TABLET | ORAL | Status: AC
  Filled 2024-05-17: qty 3

## 2024-05-17 MED ORDER — ACETAMINOPHEN 325 MG PO TABS
975.0000 mg | ORAL_TABLET | Freq: Once | ORAL | Status: AC
  Administered 2024-05-17: 975 mg via ORAL

## 2024-05-17 MED ORDER — ACETAMINOPHEN 500 MG PO TABS: 500.0000 mg | ORAL_TABLET | Freq: Four times a day (QID) | ORAL | 0 refills | Status: AC | PRN

## 2024-05-17 MED ORDER — IBUPROFEN 800 MG PO TABS: 800.0000 mg | ORAL_TABLET | Freq: Three times a day (TID) | ORAL | 0 refills | Status: AC

## 2024-05-17 NOTE — ED Triage Notes (Signed)
 Pt c/o chills, body aches, shoulder stiffness, hip pains, cough, congestion as well as headaches that started 3 days ago. Was prescribed medications last week when was seen here and took all them as well as cough medications.

## 2024-05-17 NOTE — ED Provider Notes (Signed)
 [facility]    CSN: [id number] Arrival date & time: 05/17/24  9070      History   Chief Complaint Chief Complaint  Patient presents with   Generalized Body Aches   Chills    HPI Molly Hensley is a 28 y.o. female.   Patient presents to clinic with her daughter.  Patient has been having chills, body aches, shoulder stiffness, hip pain, cough, congestion and fever.  Headache started 3 days ago.  Was seen on 05/06/24 for sinusitis, completed Augmentin.  Did feel little bit better afterwards.  Has had some nausea, no diarrhea or vomiting.  Denies abdominal pain.  Denies wheezing or shortness of breath.  Has generalized bodyaches and fatigue.    The history is provided by the patient and medical records.    Past Medical History:  Diagnosis Date   Asthma    Childhood   Chlamydia    Headache     Patient Active Problem List   Diagnosis Date Noted   Missed abortion 11/28/2023   Acute on chronic anemia 07/21/2020   Tobacco use disorder 03/08/2014    Past Surgical History:  Procedure Laterality Date   DILATION AND EVACUATION N/A 02/12/2022   Procedure: DILATATION AND EVACUATION;  Surgeon: Ozan, Jennifer, DO;  Location: [facility] OR;  Service: Gynecology;  Laterality: N/A;   DILATION AND EVACUATION N/A 11/28/2023   Procedure: DILATION AND EVACUATION, UTERUS WITH ANORA CHROMOSOME STUDIES;  Surgeon: Erik Kieth BROCKS, MD;  Location: [facility] OR;  Service: Gynecology;  Laterality: N/A;   INDUCED ABORTION     TOOTH EXTRACTION      OB History     Gravida  4   Para  1   Term  1   Preterm      AB  2   Living  1      SAB  1   IAB  1   Ectopic      Multiple  0   Live Births  1            Home Medications    Prior to Admission medications   Medication Sig Start Date End Date Taking? Authorizing Provider  acetaminophen  (TYLENOL ) 500 MG tablet Take 1 tablet (500 mg total) by mouth every 6 (six) hours as needed. 05/17/24  Yes Deanglo Hissong  N,  FNP  ibuprofen  (ADVIL ) 800 MG tablet Take 1 tablet (800 mg total) by mouth 3 (three) times daily. 05/17/24  Yes Woodley Petzold  N, FNP  azelastine  (ASTELIN ) 0.1 % nasal spray Place 2 sprays into both nostrils 2 (two) times daily. Use in each nostril as directed 02/28/24   Johnie Rumaldo LABOR, NP    Family History Family History  Problem Relation Age of Onset   Hypertension Mother    Epilepsy Mother    Heart disease Mother    Cancer Maternal Grandmother     Social History Social History   Tobacco Use   Smoking status: Some Days    Current packs/day: 0.00    Types: Cigarettes    Last attempt to quit: 01/01/2022    Years since quitting: 2.3   Smokeless tobacco: Never   Tobacco comments:    cutting down  Vaping Use   Vaping status: Never Used  Substance Use Topics   Alcohol use: No    Alcohol/week: 0.0 standard drinks of alcohol   Drug use: Yes    Types: Marijuana    Comment: trying to quit, last use 11/27/23 pm  Allergies   Patient has no known allergies.   Review of Systems Review of Systems  Per HPI  Physical Exam Triage Vital Signs ED Triage Vitals  Encounter Vitals Group     BP 05/17/24 1050 120/79     Girls Systolic BP Percentile --      Girls Diastolic BP Percentile --      Boys Systolic BP Percentile --      Boys Diastolic BP Percentile --      Pulse Rate 05/17/24 1050 (!) 107     Resp 05/17/24 1050 17     Temp 05/17/24 1050 (!) 100.4 F (38 C)     Temp Source 05/17/24 1050 Oral     SpO2 05/17/24 1050 97 %     Weight --      Height --      Head Circumference --      Peak Flow --      Pain Score 05/17/24 1049 10     Pain Loc --      Pain Education --      Exclude from Growth Chart --    No data found.  Updated Vital Signs BP 120/79 (BP Location: Left Arm)   Pulse (!) 107   Temp (!) 100.4 F (38 C) (Oral)   Resp 17   LMP 05/11/2024 (Exact Date)   SpO2 97%   Visual Acuity Right Eye Distance:   Left Eye Distance:   Bilateral  Distance:    Right Eye Near:   Left Eye Near:    Bilateral Near:     Physical Exam Vitals and nursing note reviewed.  Constitutional:      Appearance: Normal appearance.  HENT:     Head: Normocephalic and atraumatic.     Right Ear: External ear normal.     Left Ear: External ear normal.     Nose: Congestion and rhinorrhea present.     Mouth/Throat:     Mouth: Mucous membranes are moist.  Eyes:     Conjunctiva/sclera: Conjunctivae normal.  Cardiovascular:     Rate and Rhythm: Normal rate and regular rhythm.     Heart sounds: Normal heart sounds. No murmur heard. Pulmonary:     Effort: Pulmonary effort is normal. No respiratory distress.     Breath sounds: Normal breath sounds.  Neurological:     General: No focal deficit present.     Mental Status: She is alert and oriented to person, place, and time.  Psychiatric:        Mood and Affect: Mood normal.        Behavior: Behavior normal.      UC Treatments / Results  Labs (all labs ordered are listed, but only abnormal results are displayed) Labs Reviewed  POC SOFIA SARS ANTIGEN FIA  POCT INFLUENZA A/B    EKG   Radiology No results found.  Procedures Procedures (including critical care time)  Medications Ordered in UC Medications  acetaminophen  (TYLENOL ) tablet 975 mg (975 mg Oral Given 05/17/24 1103)    Initial Impression / Assessment and Plan / UC Course  I have reviewed the triage vital signs and the nursing notes.  Pertinent labs & imaging results that were available during my care of the patient were reviewed by me and considered in my medical decision making (see chart for details).  Vitals and triage reviewed, patient is hemodynamically stable.  Lungs vesicular, heart with regular rate and rhythm.  Congestion and rhinorrhea present on physical exam.  Tylenol   given for fever.  COVID and flu testing negative, suspect other viral URI.  Symptomatic management discussed.  Augmentin should be covered for  sinusitis and pneumonia, did feel better.  Suspect patient contracted viral illness after treatment for sinusitis.  Plan of care, follow-up care return precautions given, no questions at this time.  Work note provided.    Final Clinical Impressions(s) / UC Diagnoses   Final diagnoses:  Fever, unspecified  Viral illness     Discharge Instructions      COVID and flu testing were negative, you most likely have a different viral illness.  Alternate between Tylenol  and ibuprofen  every 4-6 hours to help with fever, body aches and chills.  Ensure you are getting plenty of rest and drinking at least 64 ounces of water daily.  Typical viral illnesses last 5 to 7 days in duration.  If you develop any prolonged symptoms, continued fever, wheezing, shortness of breath, or new concerning symptoms return to clinic for reevaluation.     ED Prescriptions     Medication Sig Dispense Auth. Provider   ibuprofen  (ADVIL ) 800 MG tablet Take 1 tablet (800 mg total) by mouth 3 (three) times daily. 30 tablet Dreama, Lakeishia Truluck  N, FNP   acetaminophen  (TYLENOL ) 500 MG tablet Take 1 tablet (500 mg total) by mouth every 6 (six) hours as needed. 30 tablet Dreama, Seriyah Collison  N, FNP      PDMP not reviewed this encounter.   Dreama, Jimia Gentles  N, FNP 05/17/24 1144

## 2024-05-17 NOTE — Discharge Instructions (Signed)
 COVID and flu testing were negative, you most likely have a different viral illness.  Alternate between Tylenol  and ibuprofen  every 4-6 hours to help with fever, body aches and chills.  Ensure you are getting plenty of rest and drinking at least 64 ounces of water daily.  Typical viral illnesses last 5 to 7 days in duration.  If you develop any prolonged symptoms, continued fever, wheezing, shortness of breath, or new concerning symptoms return to clinic for reevaluation.

## 2024-05-18 ENCOUNTER — Encounter (HOSPITAL_COMMUNITY): Payer: Self-pay | Admitting: Emergency Medicine

## 2024-05-18 ENCOUNTER — Emergency Department (HOSPITAL_COMMUNITY)
Admission: EM | Admit: 2024-05-18 | Discharge: 2024-05-19 | Discharge status: Against Medical Advice | Attending: Emergency Medicine | Admitting: Emergency Medicine

## 2024-05-18 ENCOUNTER — Other Ambulatory Visit: Payer: Self-pay

## 2024-05-18 ENCOUNTER — Emergency Department (HOSPITAL_COMMUNITY)

## 2024-05-18 DIAGNOSIS — J069 Acute upper respiratory infection, unspecified: Secondary | ICD-10-CM | POA: Diagnosis not present

## 2024-05-18 DIAGNOSIS — R059 Cough, unspecified: Secondary | ICD-10-CM | POA: Diagnosis not present

## 2024-05-18 DIAGNOSIS — R069 Unspecified abnormalities of breathing: Secondary | ICD-10-CM | POA: Diagnosis not present

## 2024-05-18 DIAGNOSIS — R4589 Other symptoms and signs involving emotional state: Secondary | ICD-10-CM | POA: Diagnosis not present

## 2024-05-18 DIAGNOSIS — R079 Chest pain, unspecified: Secondary | ICD-10-CM | POA: Diagnosis not present

## 2024-05-18 DIAGNOSIS — Z5321 Procedure and treatment not carried out due to patient leaving prior to being seen by health care provider: Secondary | ICD-10-CM | POA: Insufficient documentation

## 2024-05-18 DIAGNOSIS — R1111 Vomiting without nausea: Secondary | ICD-10-CM | POA: Diagnosis not present

## 2024-05-18 DIAGNOSIS — R0989 Other specified symptoms and signs involving the circulatory and respiratory systems: Secondary | ICD-10-CM | POA: Diagnosis not present

## 2024-05-18 DIAGNOSIS — R0602 Shortness of breath: Secondary | ICD-10-CM | POA: Diagnosis not present

## 2024-05-18 DIAGNOSIS — Z743 Need for continuous supervision: Secondary | ICD-10-CM | POA: Diagnosis not present

## 2024-05-18 LAB — CBC WITH DIFFERENTIAL/PLATELET
Abs Immature Granulocytes: 0.04 K/uL (ref 0.00–0.07)
Basophils Absolute: 0 K/uL (ref 0.0–0.1)
Basophils Relative: 0 %
Eosinophils Absolute: 0 K/uL (ref 0.0–0.5)
Eosinophils Relative: 0 %
HCT: 38.7 % (ref 36.0–46.0)
Hemoglobin: 12.7 g/dL (ref 12.0–15.0)
Immature Granulocytes: 1 %
Lymphocytes Relative: 16 %
Lymphs Abs: 1 K/uL (ref 0.7–4.0)
MCH: 30.5 pg (ref 26.0–34.0)
MCHC: 32.8 g/dL (ref 30.0–36.0)
MCV: 93 fL (ref 80.0–100.0)
Monocytes Absolute: 0.5 K/uL (ref 0.1–1.0)
Monocytes Relative: 8 %
Neutro Abs: 4.6 K/uL (ref 1.7–7.7)
Neutrophils Relative %: 75 %
Platelets: 212 K/uL (ref 150–400)
RBC: 4.16 MIL/uL (ref 3.87–5.11)
RDW: 13.7 % (ref 11.5–15.5)
WBC: 6.2 K/uL (ref 4.0–10.5)
nRBC: 0 % (ref 0.0–0.2)

## 2024-05-18 LAB — BASIC METABOLIC PANEL WITH GFR
Anion gap: 12 (ref 5–15)
BUN: 8 mg/dL (ref 6–20)
CO2: 17 mmol/L — ABNORMAL LOW (ref 22–32)
Calcium: 8.3 mg/dL — ABNORMAL LOW (ref 8.9–10.3)
Chloride: 108 mmol/L (ref 98–111)
Creatinine, Ser: 0.87 mg/dL (ref 0.44–1.00)
GFR, Estimated: >60 mL/min (ref >60)
Glucose, Bld: 100 mg/dL — ABNORMAL HIGH (ref 70–99)
Potassium: 3.5 mmol/L (ref 3.5–5.1)
Sodium: 137 mmol/L (ref 135–145)

## 2024-05-18 LAB — RESP PANEL BY RT-PCR (RSV, FLU A&B, COVID)  RVPGX2
Influenza A by PCR: NEGATIVE
Influenza B by PCR: NEGATIVE
Resp Syncytial Virus by PCR: NEGATIVE
SARS Coronavirus 2 by RT PCR: NEGATIVE

## 2024-05-18 NOTE — ED Provider Triage Note (Signed)
 Emergency Medicine Provider Triage Evaluation Note  Molly Hensley , a 28 y.o. female  was evaluated in triage.  Pt complains of shortness of breath for the last 2 hours.  She has been feeling unwell for the last 2 weeks and was diagnosed with an upper respiratory infection.  Was last seen yesterday at urgent care where she was evaluated and prescribed Augmentin.  She states they did not do a chest x-ray.  She has been having chills, fevers, cough, sputum production.  She also notes 3 episodes of vomiting secondary to coughing earlier today.  She last took ibuprofen  at about 7p today.  Review of Systems  Positive: Shortness of breath, fevers, chills, productive cough with sputum production, vomiting secondary to cough. Negative: Chest pain, dizziness, syncope.  Physical Exam  BP 121/82 (BP Location: Left Arm)   Pulse (!) 106   Temp 98.9 F (37.2 C)   Resp 16   Ht 5' 3 (1.6 m)   Wt 93 kg   LMP 05/11/2024 (Exact Date)   SpO2 96%   BMI 36.31 kg/m  Gen:   Awake, no distress  Resp:  Normal effort with coarse rhonchi throughout that clears with cough. MSK:   Moves extremities without difficulty  Other:  Skin is warm and clammy to touch.  Medical Decision Making  Medically screening exam initiated at 9:21 PM.  Appropriate orders placed.  Tyreka L Rodier was informed that the remainder of the evaluation will be completed by another provider, this initial triage assessment does not replace that evaluation, and the importance of remaining in the ED until their evaluation is complete.   Rosina Almarie LABOR, PA-C 05/18/24 2124

## 2024-05-18 NOTE — ED Triage Notes (Signed)
 BIB [facility] from home with c/o SOB that has been ongoing x 1.5 weeks, seen 2 times at urgent care with abx treatment without improvement. EMS reports that she has been having rib pain with deep breaths and coughing. Decreased oral intake.   BP 146/100 HR 110 Spo2 99% RA CBG 109 Temp 97.2 temporal

## 2024-05-19 NOTE — ED Notes (Signed)
 Pt called again for vital signs, no answer.

## 2024-05-19 NOTE — ED Notes (Signed)
Pt called for vital signs, no answer.
